# Patient Record
Sex: Male | Born: 1975 | Race: White | Hispanic: No | Marital: Married | Smoking: Never smoker
Health system: Southern US, Community
[De-identification: ages and names within clinical notes are randomized; demographics above are authoritative.]

## PROBLEM LIST (undated history)

## (undated) DIAGNOSIS — M1990 Unspecified osteoarthritis, unspecified site: Secondary | ICD-10-CM

## (undated) DIAGNOSIS — L409 Psoriasis, unspecified: Secondary | ICD-10-CM

## (undated) HISTORY — PX: SKIN BIOPSY: SHX1

## (undated) HISTORY — DX: Unspecified osteoarthritis, unspecified site: M19.90

## (undated) HISTORY — PX: CYSTECTOMY: SUR359

## (undated) HISTORY — DX: Psoriasis, unspecified: L40.9

---

## 2016-09-14 ENCOUNTER — Encounter: Payer: Self-pay | Admitting: Family Medicine

## 2016-09-14 ENCOUNTER — Ambulatory Visit (INDEPENDENT_AMBULATORY_CARE_PROVIDER_SITE_OTHER): Payer: BLUE CROSS/BLUE SHIELD | Admitting: Family Medicine

## 2016-09-14 VITALS — BP 114/72 | HR 66 | Temp 98.2°F | Resp 18 | Ht 66.0 in | Wt 132.0 lb

## 2016-09-14 DIAGNOSIS — Z833 Family history of diabetes mellitus: Secondary | ICD-10-CM | POA: Insufficient documentation

## 2016-09-14 DIAGNOSIS — Z79899 Other long term (current) drug therapy: Secondary | ICD-10-CM

## 2016-09-14 DIAGNOSIS — Z0000 Encounter for general adult medical examination without abnormal findings: Secondary | ICD-10-CM

## 2016-09-14 DIAGNOSIS — L4050 Arthropathic psoriasis, unspecified: Secondary | ICD-10-CM | POA: Diagnosis not present

## 2016-09-14 DIAGNOSIS — Z8349 Family history of other endocrine, nutritional and metabolic diseases: Secondary | ICD-10-CM | POA: Insufficient documentation

## 2016-09-14 DIAGNOSIS — L409 Psoriasis, unspecified: Secondary | ICD-10-CM | POA: Diagnosis not present

## 2016-09-14 DIAGNOSIS — M722 Plantar fascial fibromatosis: Secondary | ICD-10-CM | POA: Diagnosis not present

## 2016-09-14 DIAGNOSIS — Z5181 Encounter for therapeutic drug level monitoring: Secondary | ICD-10-CM | POA: Insufficient documentation

## 2016-09-14 NOTE — Progress Notes (Signed)
Chief Complaint  Patient presents with  . Annual Exam    Subjective:  Rick Santiago is a 41 y.o. male here for a health maintenance visit.  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 He was under the care of dermatology for Psoriasis He reports that he was initially diagnosed and started on mtx by Rheumatology He has relocated here from the [place] and needs to establish with Rheumatology He has no side effects from MTX and takes folic acid His last labs were wnl He reports that he is complaint and takes 12.71m once a week He reports that he takes five 2.596mtablets of mtx weekly  Psoriasis He reports that he gets mole checks at Dermatology and had a recent biopsy of a mole that was removed that was concerning for cancer He also gets treated for Psoriasis by Dermatology and needs a referral  Family history of diabetes and hemochromatosis He reports that his mother has hemochromatosis and he has a gene for it but reports that at this point he just gets iron checked with his labs His mother has a history of diabetes that is well controlled.   Patient Active Problem List   Diagnosis Date Noted  . Psoriatic arthritis (HCTalmo03/01/2017  . FHx: hemochromatosis 09/14/2016  . Encounter for methotrexate monitoring 09/14/2016  . Family history of diabetes mellitus in mother 09/14/2016    Past Medical History:  Diagnosis Date  . Arthritis     History reviewed. No pertinent surgical history.   No outpatient prescriptions prior to visit.   No facility-administered medications prior to visit.     No Known Allergies   Family History  Problem Relation Age of Onset  . Diabetes Mother   . Heart disease Father      Health Habits: Dental Exam: up to date Eye Exam: up to date Diet: balanced  Social History   Social History  . Marital status: Married    Spouse name: N/A  . Number of children: N/A  . Years of education: N/A    Occupational History  . Not on file.   Social History Main Topics  . Smoking status: Never Smoker  . Smokeless tobacco: Never Used  . Alcohol use No  . Drug use: No  . Sexual activity: Not on file   Other Topics Concern  . Not on file   Social History Narrative  . No narrative on file   History  Alcohol Use No   History  Smoking Status  . Never Smoker  Smokeless Tobacco  . Never Used   History  Drug Use No    Health Maintenance: See under health Maintenance activity for review of completion dates as well. Immunization History  Administered Date(s) Administered  . Influenza-Unspecified 04/10/2016      Depression Screen-PHQ2/9 Depression screen PHQ 2/9 09/14/2016  Decreased Interest 0  Down, Depressed, Hopeless 0  PHQ - 2 Score 0      Depression Severity and Treatment Recommendations:  0-4= None  5-9= Mild / Treatment: Support, educate to call if worse; return in one month  10-14= Moderate / Treatment: Support, watchful waiting; Antidepressant or Psycotherapy  15-19= Moderately severe / Treatment: Antidepressant OR Psychotherapy  >= 20 = Major depression, severe / Antidepressant AND Psychotherapy    Review of Systems   Review of Systems  Constitutional: Negative for chills, fever and weight loss.  HENT: Positive for ear pain. Negative for congestion, ear discharge, hearing loss, nosebleeds,  sinus pain and tinnitus.   Eyes: Negative for blurred vision, double vision and photophobia.  Respiratory: Negative for cough, hemoptysis, sputum production, shortness of breath and wheezing.   Cardiovascular: Negative for chest pain, palpitations, orthopnea and claudication.  Gastrointestinal: Negative for abdominal pain, constipation, diarrhea, nausea and vomiting.  Genitourinary: Negative for dysuria, frequency, hematuria and urgency.  Musculoskeletal: Positive for joint pain. Negative for back pain, falls, myalgias and neck pain.  Skin: Negative for itching  and rash.  Neurological: Negative for dizziness, tingling, tremors and headaches.  Psychiatric/Behavioral: Negative for depression. The patient is not nervous/anxious and does not have insomnia.     See HPI for ROS as well.    Objective:   Vitals:   09/14/16 1037  BP: 114/72  Pulse: 66  Resp: 18  Temp: 98.2 F (36.8 C)  TempSrc: Oral  SpO2: 99%  Weight: 132 lb (59.9 kg)  Height: 5' 6"  (1.676 m)    Body mass index is 21.31 kg/m.  Physical Exam  Constitutional: He is oriented to person, place, and time. He appears well-developed and well-nourished.  HENT:  Head: Normocephalic and atraumatic.  Right Ear: External ear normal.  Left Ear: External ear normal.  Nose: Nose normal.  Mouth/Throat: Oropharynx is clear and moist.  Eyes: Conjunctivae and EOM are normal. Right eye exhibits no discharge. Left eye exhibits no discharge. No scleral icterus.  Fundoscopic exam:      The right eye shows no AV nicking, no exudate, no hemorrhage and no papilledema.       The left eye shows no AV nicking, no exudate, no hemorrhage and no papilledema.  Neck: Normal range of motion. Neck supple. No thyromegaly present.  Cardiovascular: Normal rate, regular rhythm, normal heart sounds and intact distal pulses.   No murmur heard. Pulmonary/Chest: Effort normal and breath sounds normal. No respiratory distress. He has no wheezes. He has no rales. He exhibits no tenderness.  Abdominal: Soft. Bowel sounds are normal. He exhibits no distension and no mass. There is no tenderness. There is no rebound.  Musculoskeletal: Normal range of motion. He exhibits no edema, tenderness or deformity.  Neurological: He is alert and oriented to person, place, and time. He has normal reflexes. He displays normal reflexes. No cranial nerve deficit.  Skin: Skin is warm.  [place] pink plaques on shin and behind ears  Psychiatric: He has a normal mood and affect. His behavior is normal. Judgment and thought content  normal.       Assessment/Plan:   Patient was seen for a health maintenance exam.  Counseled the patient on health maintenance issues. Reviewed her health mainteance schedule and ordered appropriate tests (see orders.) Counseled on regular exercise and weight management. Recommend regular eye exams and dental cleaning.   The following issues were addressed today for health maintenance:   Jamine was seen today for annual exam.  Diagnoses and all orders for this visit:  Health maintenance examination- Age appropriate screenings discussed -     Comprehensive metabolic panel -     Lipid panel  Psoriatic arthritis ([place])- continue MTX and folate Referral placed for Rheumatology -     Ambulatory referral to Rheumatology  FHx: hemochromatosis -     Ambulatory referral to Rheumatology -     CBC with Differential/Platelet -     Iron  Encounter for methotrexate monitoring- pt low risk for TB exposure thus will not get xray Will check labs to monitor for effects of MTX Advised routine eye exams Pt  up to date for eye exams -     Ambulatory referral to Rheumatology -     Comprehensive metabolic panel -     CBC with Differential/Platelet -     Iron  Psoriasis- continue MTX, follow up with Dermatology -     Ambulatory referral to Dermatology  Plantar fasciitis of right foot- stable currently, will refer to Podiatry for continuity of care -     Ambulatory referral to Podiatry  Family history of diabetes mellitus in mother- will screen today with FASTING glucose on CMP    No Follow-up on file.    Body mass index is 21.31 kg/m.:  Discussed the patient's BMI with patient. The BMI body mass index is 21.31 kg/m.     No future appointments.  Patient Instructions       IF you received an x-ray today, you will receive an invoice from [facility]. Please contact [facility] at [phone] with questions or concerns regarding your invoice.   IF you received  labwork today, you will receive an invoice from [place]. Please contact [facility] at [phone] with questions or concerns regarding your invoice.   Our billing staff will not be able to assist you with questions regarding bills from these companies.  You will be contacted with the lab results as soon as they are available. The fastest way to get your results is to activate your My Chart account. Instructions are located on the last page of this paperwork. If you have not heard from [place] regarding the results in 2 weeks, please contact this office.     Health Maintenance, Male A healthy lifestyle and preventive care is important for your health and wellness. Ask your health care provider about what schedule of regular examinations is right for you. What should I know about weight and diet?  Eat a Healthy Diet  Eat plenty of vegetables, fruits, whole grains, low-fat dairy products, and lean protein.  Do not eat a lot of foods high in solid fats, added sugars, or salt. Maintain a Healthy Weight  Regular exercise can help you achieve or maintain a healthy weight. You should:  Do at least 150 minutes of exercise each week. The exercise should increase your heart rate and make you sweat (moderate-intensity exercise).  Do strength-training exercises at least twice a week. Watch Your Levels of Cholesterol and Blood Lipids  Have your blood tested for lipids and cholesterol every 5 years starting at 43 years of age. If you are at high risk for heart disease, you should start having your blood tested when you are 43 years old. You may need to have your cholesterol levels checked more often if:  Your lipid or cholesterol levels are high.  You are older than 43 years of age.  You are at high risk for heart disease. What should I know about cancer screening? Many types of cancers can be detected early and may often be prevented. Lung Cancer  You should be screened every year for lung cancer  if:  You are a current smoker who has smoked for at least 30 years.  You are a former smoker who has quit within the past 15 years.  Talk to your health care provider about your screening options, when you should start screening, and how often you should be screened. Colorectal Cancer  Routine colorectal cancer screening usually begins at 43 years of age and should be repeated every 5-10 years until you are 43 years old. You may need  to be screened more often if early forms of precancerous polyps or small growths are found. Your health care provider may recommend screening at an earlier age if you have risk factors for colon cancer.  Your health care provider may recommend using home test kits to check for hidden blood in the stool.  A small camera at the end of a tube can be used to examine your colon (sigmoidoscopy or colonoscopy). This checks for the earliest forms of colorectal cancer. Prostate and Testicular Cancer  Depending on your age and overall health, your health care provider may do certain tests to screen for prostate and testicular cancer.  Talk to your health care provider about any symptoms or concerns you have about testicular or prostate cancer. Skin Cancer  Check your skin from head to toe regularly.  Tell your health care provider about any new moles or changes in moles, especially if:  There is a change in a mole's size, shape, or color.  You have a mole that is larger than a pencil eraser.  Always use sunscreen. Apply sunscreen liberally and repeat throughout the day.  Protect yourself by wearing long sleeves, pants, a wide-brimmed hat, and sunglasses when outside. What should I know about heart disease, diabetes, and high blood pressure?  If you are 41-40 years of age, have your blood pressure checked every 3-5 years. If you are 60 years of age or older, have your blood pressure checked every year. You should have your blood pressure measured twice-once when  you are at a hospital or clinic, and once when you are not at a hospital or clinic. Record the average of the two measurements. To check your blood pressure when you are not at a hospital or clinic, you can use:  An automated blood pressure machine at a pharmacy.  A home blood pressure monitor.  Talk to your health care provider about your target blood pressure.  If you are between 82-32 years old, ask your health care provider if you should take aspirin to prevent heart disease.  Have regular diabetes screenings by checking your fasting blood sugar level.  If you are at a normal weight and have a low risk for diabetes, have this test once every three years after the age of 24.  If you are overweight and have a high risk for diabetes, consider being tested at a younger age or more often.  A one-time screening for abdominal aortic aneurysm (AAA) by ultrasound is recommended for men aged 40-75 years who are current or former smokers. What should I know about preventing infection? Hepatitis B  If you have a higher risk for hepatitis B, you should be screened for this virus. Talk with your health care provider to find out if you are at risk for hepatitis B infection. Hepatitis C  Blood testing is recommended for:  Everyone born from [birth year] through 1967.  Anyone with known risk factors for hepatitis C. Sexually Transmitted Diseases (STDs)  You should be screened each year for STDs including gonorrhea and chlamydia if:  You are sexually active and are younger than 43 years of age.  You are older than 43 years of age and your health care provider tells you that you are at risk for this type of infection.  Your sexual activity has changed since you were last screened and you are at an increased risk for chlamydia or gonorrhea. Ask your health care provider if you are at risk.  Talk with your health care  provider about whether you are at high risk of being infected with HIV. Your health care  provider may recommend a prescription medicine to help prevent HIV infection. What else can I do?  Schedule regular health, dental, and eye exams.  Stay current with your vaccines (immunizations).  Do not use any tobacco products, such as cigarettes, chewing tobacco, and e-cigarettes. If you need help quitting, ask your health care provider.  Limit alcohol intake to no more than 2 drinks per day. One drink equals 12 ounces of beer, 5 ounces of wine, or 1 ounces of hard liquor.  Do not use street drugs.  Do not share needles.  Ask your health care provider for help if you need support or information about quitting drugs.  Tell your health care provider if you often feel depressed.  Tell your health care provider if you have ever been abused or do not feel safe at home. This information is not intended to replace advice given to you by your health care provider. Make sure you discuss any questions you have with your health care provider. Document Released: 12/24/2007 Document Revised: 02/24/2016 Document Reviewed: 03/31/2015 Elsevier Interactive Patient Education  2017 [employer].

## 2016-09-14 NOTE — Patient Instructions (Addendum)
IF you received an x-ray today, you will receive an invoice from [facility]. Please contact [facility] at [phone] with questions or concerns regarding your invoice.   IF you received labwork today, you will receive an invoice from [place]. Please contact [facility] at [phone] with questions or concerns regarding your invoice.   Our billing staff will not be able to assist you with questions regarding bills from these companies.  You will be contacted with the lab results as soon as they are available. The fastest way to get your results is to activate your My Chart account. Instructions are located on the last page of this paperwork. If you have not heard from [place] regarding the results in 2 weeks, please contact this office.     Health Maintenance, Male A healthy lifestyle and preventive care is important for your health and wellness. Ask your health care provider about what schedule of regular examinations is right for you. What should I know about weight and diet?  Eat a Healthy Diet  Eat plenty of vegetables, fruits, whole grains, low-fat dairy products, and lean protein.  Do not eat a lot of foods high in solid fats, added sugars, or salt. Maintain a Healthy Weight  Regular exercise can help you achieve or maintain a healthy weight. You should:  Do at least 150 minutes of exercise each week. The exercise should increase your heart rate and make you sweat (moderate-intensity exercise).  Do strength-training exercises at least twice a week. Watch Your Levels of Cholesterol and Blood Lipids  Have your blood tested for lipids and cholesterol every 5 years starting at 43 years of age. If you are at high risk for heart disease, you should start having your blood tested when you are 43 years old. You may need to have your cholesterol levels checked more often if:  Your lipid or cholesterol levels are high.  You are older than 43 years of age.  You are  at high risk for heart disease. What should I know about cancer screening? Many types of cancers can be detected early and may often be prevented. Lung Cancer  You should be screened every year for lung cancer if:  You are a current smoker who has smoked for at least 30 years.  You are a former smoker who has quit within the past 15 years.  Talk to your health care provider about your screening options, when you should start screening, and how often you should be screened. Colorectal Cancer  Routine colorectal cancer screening usually begins at 43 years of age and should be repeated every 5-10 years until you are 43 years old. You may need to be screened more often if early forms of precancerous polyps or small growths are found. Your health care provider may recommend screening at an earlier age if you have risk factors for colon cancer.  Your health care provider may recommend using home test kits to check for hidden blood in the stool.  A small camera at the end of a tube can be used to examine your colon (sigmoidoscopy or colonoscopy). This checks for the earliest forms of colorectal cancer. Prostate and Testicular Cancer  Depending on your age and overall health, your health care provider may do certain tests to screen for prostate and testicular cancer.  Talk to your health care provider about any symptoms or concerns you have about testicular or prostate cancer. Skin Cancer  Check your skin from head to toe regularly.  Tell your health care provider about any new moles or changes in moles, especially if:  There is a change in a mole's size, shape, or color.  You have a mole that is larger than a pencil eraser.  Always use sunscreen. Apply sunscreen liberally and repeat throughout the day.  Protect yourself by wearing long sleeves, pants, a wide-brimmed hat, and sunglasses when outside. What should I know about heart disease, diabetes, and high blood pressure?  If you are  60-64 years of age, have your blood pressure checked every 3-5 years. If you are 56 years of age or older, have your blood pressure checked every year. You should have your blood pressure measured twice-once when you are at a hospital or clinic, and once when you are not at a hospital or clinic. Record the average of the two measurements. To check your blood pressure when you are not at a hospital or clinic, you can use:  An automated blood pressure machine at a pharmacy.  A home blood pressure monitor.  Talk to your health care provider about your target blood pressure.  If you are between 66-106 years old, ask your health care provider if you should take aspirin to prevent heart disease.  Have regular diabetes screenings by checking your fasting blood sugar level.  If you are at a normal weight and have a low risk for diabetes, have this test once every three years after the age of 22.  If you are overweight and have a high risk for diabetes, consider being tested at a younger age or more often.  A one-time screening for abdominal aortic aneurysm (AAA) by ultrasound is recommended for men aged 100-75 years who are current or former smokers. What should I know about preventing infection? Hepatitis B  If you have a higher risk for hepatitis B, you should be screened for this virus. Talk with your health care provider to find out if you are at risk for hepatitis B infection. Hepatitis C  Blood testing is recommended for:  Everyone born from [birth year] through 1967.  Anyone with known risk factors for hepatitis C. Sexually Transmitted Diseases (STDs)  You should be screened each year for STDs including gonorrhea and chlamydia if:  You are sexually active and are younger than 43 years of age.  You are older than 43 years of age and your health care provider tells you that you are at risk for this type of infection.  Your sexual activity has changed since you were last screened and you are at  an increased risk for chlamydia or gonorrhea. Ask your health care provider if you are at risk.  Talk with your health care provider about whether you are at high risk of being infected with HIV. Your health care provider may recommend a prescription medicine to help prevent HIV infection. What else can I do?  Schedule regular health, dental, and eye exams.  Stay current with your vaccines (immunizations).  Do not use any tobacco products, such as cigarettes, chewing tobacco, and e-cigarettes. If you need help quitting, ask your health care provider.  Limit alcohol intake to no more than 2 drinks per day. One drink equals 12 ounces of beer, 5 ounces of wine, or 1 ounces of hard liquor.  Do not use street drugs.  Do not share needles.  Ask your health care provider for help if you need support or information about quitting drugs.  Tell your health care provider if you often feel depressed.  Tell your health care provider if you have ever been abused or do not feel safe at home. This information is not intended to replace advice given to you by your health care provider. Make sure you discuss any questions you have with your health care provider. Document Released: 12/24/2007 Document Revised: 02/24/2016 Document Reviewed: 03/31/2015 Elsevier Interactive Patient Education  2017 [employer].

## 2016-09-15 ENCOUNTER — Encounter: Payer: Self-pay | Admitting: Family Medicine

## 2016-09-15 LAB — CBC WITH DIFFERENTIAL/PLATELET
BASOS: 1 %
Basophils Absolute: 0 10*3/uL (ref 0.0–0.2)
EOS (ABSOLUTE): 0.1 10*3/uL (ref 0.0–0.4)
Eos: 1 %
HEMOGLOBIN: 15.9 g/dL (ref 13.0–17.7)
Hematocrit: 44.7 % (ref 37.5–51.0)
IMMATURE GRANULOCYTES: 0 %
Immature Grans (Abs): 0 10*3/uL (ref 0.0–0.1)
LYMPHS: 34 %
Lymphocytes Absolute: 1.7 10*3/uL (ref 0.7–3.1)
MCH: 31.6 pg (ref 26.6–33.0)
MCHC: 35.6 g/dL (ref 31.5–35.7)
MCV: 89 fL (ref 79–97)
MONOCYTES: 6 %
Monocytes Absolute: 0.3 10*3/uL (ref 0.1–0.9)
NEUTROS PCT: 58 %
Neutrophils Absolute: 3 10*3/uL (ref 1.4–7.0)
PLATELETS: 265 10*3/uL (ref 150–379)
RBC: 5.03 x10E6/uL (ref 4.14–5.80)
RDW: 14.5 % (ref 12.3–15.4)
WBC: 5.1 10*3/uL (ref 3.4–10.8)

## 2016-09-15 LAB — COMPREHENSIVE METABOLIC PANEL
ALT: 24 IU/L (ref 0–44)
AST: 22 IU/L (ref 0–40)
Albumin/Globulin Ratio: 2 (ref 1.2–2.2)
Albumin: 4.8 g/dL (ref 3.5–5.5)
Alkaline Phosphatase: 83 IU/L (ref 39–117)
BUN/Creatinine Ratio: 8 — ABNORMAL LOW (ref 9–20)
BUN: 8 mg/dL (ref 6–24)
Bilirubin Total: 0.7 mg/dL (ref 0.0–1.2)
CALCIUM: 9.4 mg/dL (ref 8.7–10.2)
CO2: 22 mmol/L (ref 18–29)
Chloride: 102 mmol/L (ref 96–106)
Creatinine, Ser: 0.97 mg/dL (ref 0.76–1.27)
GFR, EST AFRICAN AMERICAN: 111 mL/min/{1.73_m2} (ref >59)
GFR, EST NON AFRICAN AMERICAN: 96 mL/min/{1.73_m2} (ref >59)
GLUCOSE: 92 mg/dL (ref 65–99)
Globulin, Total: 2.4 g/dL (ref 1.5–4.5)
POTASSIUM: 4.4 mmol/L (ref 3.5–5.2)
Sodium: 142 mmol/L (ref 134–144)
TOTAL PROTEIN: 7.2 g/dL (ref 6.0–8.5)

## 2016-09-15 LAB — LIPID PANEL
CHOLESTEROL TOTAL: 186 mg/dL (ref 100–199)
Chol/HDL Ratio: 3.3 ratio units (ref 0.0–5.0)
HDL: 57 mg/dL (ref >39)
LDL CALC: 117 mg/dL — AB (ref 0–99)
Triglycerides: 58 mg/dL (ref 0–149)
VLDL CHOLESTEROL CAL: 12 mg/dL (ref 5–40)

## 2016-09-15 LAB — IRON: IRON: 113 ug/dL (ref 38–169)

## 2016-09-29 DIAGNOSIS — M722 Plantar fascial fibromatosis: Secondary | ICD-10-CM | POA: Insufficient documentation

## 2016-09-29 DIAGNOSIS — L409 Psoriasis, unspecified: Secondary | ICD-10-CM | POA: Insufficient documentation

## 2016-09-29 DIAGNOSIS — Z79899 Other long term (current) drug therapy: Secondary | ICD-10-CM | POA: Insufficient documentation

## 2016-09-29 NOTE — Progress Notes (Signed)
Office Visit Note  Patient: Rick Santiago             Date of Birth: 09/04/1975           MRN: [id number]             PCP: [name], MD Referring: [name], MD Visit Date: 10/03/2016 Occupation: @GUAROCC @    Subjective:  Pain in hands  History of Present Illness: Rick Santiago is a 41 y.o. male seen in consultation per request of his PCP area and according to patient he developed psoriasis when he was 41 years old. The psoriasis fluctuated and he never required any treatment for that. About 2-1/2 years ago he started having increased pain and swelling in his hands which she describes over the MCPs and PIPs. His mother has history of hemachromatosis. For that reason he was seen by her rheumatologist in [place] who did workup and diagnosed him with psoriatic arthritis. He was started on methotrexate 2.5 mg 5 tablets by mouth every week along with folic acid. His labs were monitored which were normal. He states he did fairly well on that dose he had occasional flares about every its months which lasted for about 2-3 weeks. But he has not had any flare in the last 1 year. He believes his arthritis is quite well-controlled on current dose of methotrexate. He had few episodes of plantar fasciitis before starting methotrexate. He denies any pain and swelling in any other joints. There is no history of eye involvement.   Activities of Daily Living:  Patient reports morning stiffness for 0 minute.   Patient Denies nocturnal pain.  Difficulty dressing/grooming: Denies Difficulty climbing stairs: Denies Difficulty getting out of chair: Denies Difficulty using hands for taps, buttons, cutlery, and/or writing: Denies   Review of Systems  Constitutional: Negative for fatigue, night sweats and weakness ( ).  HENT: Negative for mouth sores, mouth dryness and nose dryness.   Eyes: Negative for redness and dryness.  Respiratory: Negative for shortness of breath and difficulty  breathing.   Cardiovascular: Negative for chest pain, palpitations, hypertension, irregular heartbeat and swelling in legs/feet.  Gastrointestinal: Negative for constipation and diarrhea.  Endocrine: Negative for increased urination.  Musculoskeletal: Negative for arthralgias, joint pain, joint swelling, myalgias, muscle weakness, morning stiffness, muscle tenderness and myalgias.  Skin: Positive for rash. Negative for color change, hair loss, nodules/bumps, skin tightness, ulcers and sensitivity to sunlight.       Lesions of psoriasis on extremities and the umbilical area  Allergic/Immunologic: Negative for susceptible to infections.  Neurological: Negative for dizziness, fainting, memory loss and night sweats.  Hematological: Negative for swollen glands.  Psychiatric/Behavioral: Negative for depressed mood and sleep disturbance. The patient is not nervous/anxious.     PMFS History:  Patient Active Problem List   Diagnosis Date Noted  . Plantar fasciitis 09/29/2016  . Psoriasis 09/29/2016  . High risk medication use 09/29/2016  . Psoriatic arthritis ([place]) 09/14/2016  . FHx: hemochromatosis 09/14/2016  . Encounter for methotrexate monitoring 09/14/2016  . Family history of diabetes mellitus in mother 09/14/2016    Past Medical History:  Diagnosis Date  . Arthritis     Family History  Problem Relation Age of Onset  . Diabetes Mother   . Heart disease Father    History reviewed. No pertinent surgical history. Social History   Social History Narrative  . No narrative on file     Objective: Vital Signs: BP 107/73 (BP Location: Right Arm, Patient  Position: Sitting, Cuff Size: Normal)   Pulse (!) 54   Resp 12   Ht 5' 7"  (1.702 m)   Wt 137 lb (62.1 kg)   BMI 21.46 kg/m    Physical Exam  Constitutional: He is oriented to person, place, and time. He appears well-developed and well-nourished.  HENT:  Head: Normocephalic and atraumatic.  Eyes: Conjunctivae and EOM are  normal. Pupils are equal, round, and reactive to light.  Neck: Normal range of motion. Neck supple.  Cardiovascular: Normal rate, regular rhythm and normal heart sounds.   Pulmonary/Chest: Effort normal and breath sounds normal.  Abdominal: Soft. Bowel sounds are normal.  Neurological: He is alert and oriented to person, place, and time.  Skin: Skin is warm and dry. Capillary refill takes less than 2 seconds. Rash noted.  Psoriasis plaques on bilateral elbows bilateral shin area,scalp and umbilical area  Psychiatric: He has a normal mood and affect. His behavior is normal.  Nursing note and vitals reviewed.    Musculoskeletal Exam: C-spine and thoracic lumbar spine good range of motion no SI joint tenderness. Shoulder joints, elbow joints, wrist joints, MCPs, PIPs DIPs with good range of motion with no synovitis. Hip joints knee joints ankles MTPs PIPs DIPs with good range of motion. He had no plantar fasciitis Achillis tendinitis on examination.  CDAI Exam: No CDAI exam completed.    Investigation: Findings:  09/14/2016 CBC normal, CMP normal, lipid panel LDL 117    Imaging: Xr Foot 2 Views Left  Result Date: 10/03/2016 No MTP PIP/DIP narrowing was noted. A small calcaneal spur was noted. No erosive changes were noted. Impression: These findings are consistent with osteoarthritis of the foot  Xr Foot 2 Views Right  Result Date: 10/03/2016 No MTP PIP/DIP narrowing was noted. A small calcaneal spur was noted. No erosive changes were noted. Impression: These findings are consistent with osteoarthritis of the foot  Xr Hand 2 View Left  Result Date: 10/03/2016 Mild PIP/DIP narrowing was noted. No MCP joint or intercarpal joint space narrowing was noted. No erosive changes were noted. Impression: These findings could be consistent with mild osteoarthritis or psoriatic arthritis.  Xr Hand 2 View Right  Result Date: 10/03/2016 Mild PIP/DIP narrowing was noted. No MCP joint or  intercarpal joint space narrowing was noted. No erosive changes were noted. Impression: These findings could be consistent with mild osteoarthritis or psoriatic arthritis.  Xr Pelvis 1-2 Views  Result Date: 10/03/2016 Mild SI joint sclerosis noted in the left SI joint. No SI joint narrowing was noted. These findings could be consistent with psoriatic arthritis.   Speciality Comments: No specialty comments available.    Procedures:  No procedures performed Allergies: Patient has no known allergies.   Assessment / Plan:     Visit Diagnoses: Psoriatic arthritis ([place]): He had no synovitis on examination today he states he has not had a flare in one year. He is on low-dose methotrexate. He does not want to increase the dose of methotrexate. He states she's been concerned about the side effects of methotrexate although he's never experienced any side effects from methotrexate. Today detailed counseling was provided handout was given consent was taken. We also discussed different treatment options. He does not like any of the biologic agents. I discussed the option of trying Rutherford Nail if it's approved and a handout was given.  Psoriasis: He has several psoriasis patches on examination today and is a scalp behind its years, on his trunk, and umbilical area, over elbows, bilateral shins.  High risk medication use - Methotrexate 5 tablets by mouth every week, folic acid 1 mg by mouth daily  Plantar fasciitis: He had no recent episodes  FHx: hemochromatosis - in mother  Family history of psoriasis - In paternal grandfather    Orders: Orders Placed This Encounter  Procedures  . XR Hand 2 View Right  . XR Hand 2 View Left  . XR Foot 2 Views Right  . XR Foot 2 Views Left  . XR Pelvis 1-2 Views  . DG Chest 2 View  . Quantiferon tb gold assay (blood)  . Serum protein electrophoresis with reflex  . Hepatitis B core antibody, IgM  . Hepatitis B surface antigen  . Hepatitis C antibody  . HIV  antibody  . IgG, IgA, IgM  . CBC with Differential/Platelet  . COMPLETE METABOLIC PANEL WITH GFR   No orders of the defined types were placed in this encounter.   Face-to-face time spent with patient was 45 minutes. 50% of time was spent in counseling and coordination of care.  Follow-Up Instructions: Return in about 5 months (around 03/05/2017) for Psoriatic arthritis, psoriasis.   Bo Merino, MD  Note - This record has been created using Editor, commissioning.  Chart creation errors have been sought, but may not always  have been located. Such creation errors do not reflect on  the standard of medical care.

## 2016-10-03 ENCOUNTER — Ambulatory Visit (HOSPITAL_COMMUNITY)
Admission: RE | Admit: 2016-10-03 | Discharge: 2016-10-03 | Disposition: A | Discharge status: Home / Self Care | Payer: BLUE CROSS/BLUE SHIELD | Source: Ambulatory Visit | Attending: Rheumatology | Admitting: Rheumatology

## 2016-10-03 ENCOUNTER — Ambulatory Visit (INDEPENDENT_AMBULATORY_CARE_PROVIDER_SITE_OTHER): Payer: Self-pay

## 2016-10-03 ENCOUNTER — Ambulatory Visit (INDEPENDENT_AMBULATORY_CARE_PROVIDER_SITE_OTHER): Payer: BLUE CROSS/BLUE SHIELD | Admitting: Rheumatology

## 2016-10-03 ENCOUNTER — Encounter: Payer: Self-pay | Admitting: Rheumatology

## 2016-10-03 VITALS — BP 107/73 | HR 54 | Resp 12 | Ht 67.0 in | Wt 137.0 lb

## 2016-10-03 DIAGNOSIS — M722 Plantar fascial fibromatosis: Secondary | ICD-10-CM

## 2016-10-03 DIAGNOSIS — Z840 Family history of diseases of the skin and subcutaneous tissue: Secondary | ICD-10-CM

## 2016-10-03 DIAGNOSIS — Z8349 Family history of other endocrine, nutritional and metabolic diseases: Secondary | ICD-10-CM | POA: Diagnosis not present

## 2016-10-03 DIAGNOSIS — L4050 Arthropathic psoriasis, unspecified: Secondary | ICD-10-CM

## 2016-10-03 DIAGNOSIS — L409 Psoriasis, unspecified: Secondary | ICD-10-CM

## 2016-10-03 DIAGNOSIS — Z79899 Other long term (current) drug therapy: Secondary | ICD-10-CM

## 2016-10-03 NOTE — Progress Notes (Signed)
Pharmacy Note  Subjective: Patient presents today to the [facility] to see Dr. Estanislado Pandy.  Patient is currently taking methotrexate 5 tablets weekly and folic acid 1 mg daily.  Patient seen by the pharmacist for counseling on methotrexate.    Objective: CBC    Component Value Date/Time   WBC 5.1 09/14/2016 1206   RBC 5.03 09/14/2016 1206   HCT 44.7 09/14/2016 1206   PLT 265 09/14/2016 1206   MCV 89 09/14/2016 1206   MCH 31.6 09/14/2016 1206   MCHC 35.6 09/14/2016 1206   RDW 14.5 09/14/2016 1206   LYMPHSABS 1.7 09/14/2016 1206   EOSABS 0.1 09/14/2016 1206   BASOSABS 0.0 09/14/2016 1206   CMP     Component Value Date/Time   NA 142 09/14/2016 1206   K 4.4 09/14/2016 1206   CL 102 09/14/2016 1206   CO2 22 09/14/2016 1206   GLUCOSE 92 09/14/2016 1206   BUN 8 09/14/2016 1206   CREATININE 0.97 09/14/2016 1206   CALCIUM 9.4 09/14/2016 1206   PROT 7.2 09/14/2016 1206   ALBUMIN 4.8 09/14/2016 1206   AST 22 09/14/2016 1206   ALT 24 09/14/2016 1206   ALKPHOS 83 09/14/2016 1206   BILITOT 0.7 09/14/2016 1206   GFRNONAA 96 09/14/2016 1206   GFRAA 111 09/14/2016 1206   TB Gold: ordered today Hepatitis panel: ordered today HIV: ordered today  Chest-xray:  Ordered today  Contraception: Strict contraception advised  Assessment/Plan:  Patient was counseled on the purpose, proper use, and adverse effects of methotrexate including nausea, infection, and signs and symptoms of pneumonitis.  Reviewed instructions with patient to take methotrexate weekly along with folic acid daily.  Discussed the importance of frequent monitoring of kidney and liver function and blood counts, and provided patient with standing lab instructions.  Patient will be due for standing labs again in June 2018 and every 3 months.  Provided patient with standing lab instructions and standing lab orders were placed.  Counseled patient to avoid NSAIDs and alcohol while on methotrexate.  Provided patient  with educational materials on methotrexate and answered all questions.  Patient consented to methotrexate use.  Will upload into chart.    Elisabeth Most, Pharm.D., BCPS Clinical Pharmacist Pager: [phone] Phone: [phone] 10/03/2016 9:07 AM

## 2016-10-03 NOTE — Progress Notes (Signed)
Notify pt

## 2016-10-03 NOTE — Patient Instructions (Addendum)
Rick Santiago We placed an order today for your Rick lab work.    Please come back and get your Rick Santiago in June 2018 and every 3 months  We have open lab Monday through Friday from 8:30-11:30 AM and 1:30-4 PM at the office of Dr. Tresa Moore, PA.   The office is located at [address] [place], [place], [place] [address] No appointment is necessary.   Santiago are drawn by [facility].  You may receive a bill from [facility] for your lab work.     Methotrexate tablets What is this medicine? METHOTREXATE (METH oh TREX ate) is a chemotherapy drug used to treat cancer including breast cancer, leukemia, and lymphoma. This medicine can also be used to treat psoriasis and certain kinds of arthritis. This medicine may be used for other purposes; ask your health care provider or pharmacist if you have questions. COMMON BRAND NAME(S): Rheumatrex, Trexall What should I tell my health care provider before I take this medicine? They need to know if you have any of these conditions: -fluid in the stomach area or lungs -if you often drink alcohol -infection or immune system problems -kidney disease or on hemodialysis -liver disease -low blood counts, like low white cell, platelet, or red cell counts -lung disease -radiation therapy -stomach ulcers -ulcerative colitis -an unusual or allergic reaction to methotrexate, other medicines, foods, dyes, or preservatives -pregnant or trying to get pregnant -breast-feeding How should I use this medicine? Take this medicine by mouth with a glass of water. Follow the directions on the prescription label. Take your medicine at regular intervals. Do not take it more often than directed. Do not stop taking except on your doctor's advice. Make sure you know why you are taking this medicine and how often you should take it. If this medicine is used for a condition that is not cancer, like arthritis or psoriasis, it should be taken weekly,  NOT daily. Taking this medicine more often than directed can cause serious side effects, even death. Talk to your healthcare provider about safe handling and disposal of this medicine. You may need to take special precautions. Talk to your pediatrician regarding the use of this medicine in children. While this drug may be prescribed for selected conditions, precautions do apply. Overdosage: If you think you have taken too much of this medicine contact a poison control center or emergency room at once. NOTE: This medicine is only for you. Do not share this medicine with others. What if I miss a dose? If you miss a dose, talk with your doctor or health care professional. Do not take double or extra doses. What may interact with this medicine? This medicine may interact with the following medication: -acitretin -aspirin and aspirin-like medicines including salicylates -azathioprine -certain antibiotics like penicillins, tetracycline, and chloramphenicol -cyclosporine -gold -hydroxychloroquine -live virus vaccines -NSAIDs, medicines for pain and inflammation, like ibuprofen or naproxen -other cytotoxic agents -penicillamine -phenylbutazone -phenytoin -probenecid -retinoids such as isotretinoin and tretinoin -steroid medicines like prednisone or cortisone -sulfonamides like sulfasalazine and trimethoprim/sulfamethoxazole -theophylline This list may not describe all possible interactions. Give your health care provider a list of all the medicines, herbs, non-prescription drugs, or dietary supplements you use. Also tell them if you smoke, drink alcohol, or use illegal drugs. Some items may interact with your medicine. What should I watch for while using this medicine? Avoid alcoholic drinks. This medicine can make you more sensitive to the sun. Keep out of the sun. If you cannot avoid  being in the sun, wear protective clothing and use sunscreen. Do not use sun lamps or tanning  beds/booths. You may need blood work done while you are taking this medicine. Call your doctor or health care professional for advice if you get a fever, chills or sore throat, or other symptoms of a cold or flu. Do not treat yourself. This drug decreases your body's ability to fight infections. Try to avoid being around people who are sick. This medicine may increase your risk to bruise or bleed. Call your doctor or health care professional if you notice any unusual bleeding. Check with your doctor or health care professional if you get an attack of severe diarrhea, nausea and vomiting, or if you sweat a lot. The loss of too much body fluid can make it dangerous for you to take this medicine. Talk to your doctor about your risk of cancer. You may be more at risk for certain types of cancers if you take this medicine. Both men and women must use effective birth control with this medicine. Do not become pregnant while taking this medicine or until at least 1 normal menstrual cycle has occurred after stopping it. Women should inform their doctor if they wish to become pregnant or think they might be pregnant. Men should not father a child while taking this medicine and for 3 months after stopping it. There is a potential for serious side effects to an unborn child. Talk to your health care professional or pharmacist for more information. Do not breast-feed an infant while taking this medicine. What side effects may I notice from receiving this medicine? Side effects that you should report to your doctor or health care professional as soon as possible: -allergic reactions like skin rash, itching or hives, swelling of the face, lips, or tongue -breathing problems or shortness of breath -diarrhea -dry, nonproductive cough -low blood counts - this medicine may decrease the number of white blood cells, red blood cells and platelets. You may be at increased risk for infections and bleeding. -mouth  sores -redness, blistering, peeling or loosening of the skin, including inside the mouth -signs of infection - fever or chills, cough, sore throat, pain or trouble passing urine -signs and symptoms of bleeding such as bloody or black, tarry stools; red or dark-brown urine; spitting up blood or brown material that looks like coffee grounds; red spots on the skin; unusual bruising or bleeding from the eye, gums, or nose -signs and symptoms of kidney injury like trouble passing urine or change in the amount of urine -signs and symptoms of liver injury like dark yellow or brown urine; general ill feeling or flu-like symptoms; light-colored stools; loss of appetite; nausea; right upper belly pain; unusually weak or tired; yellowing of the eyes or skin Side effects that usually do not require medical attention (report to your doctor or health care professional if they continue or are bothersome): -dizziness -hair loss -tiredness -upset stomach -vomiting This list may not describe all possible side effects. Call your doctor for medical advice about side effects. You may report side effects to FDA at 1-800-FDA-1088. Where should I keep my medicine? Keep out of the reach of children. Store at room temperature between 20 and 25 degrees C (68 and 77 degrees F). Protect from light. Throw away any unused medicine after the expiration date. NOTE: This sheet is a summary. It may not cover all possible information. If you have questions about this medicine, talk to your doctor, pharmacist, or health  care provider.  2018 Elsevier/Gold Standard (2015-03-02 05:39:22)  Apremilast oral tablets What is this medicine? APREMILAST (a PRE mil ast) is used to treat plaque psoriasis and psoriatic arthritis. This medicine may be used for other purposes; ask your health care provider or pharmacist if you have questions. COMMON BRAND NAME(S): Rutherford Nail What should I tell my health care provider before I take this  medicine? They need to know if you have any of these conditions: -dehydration -kidney disease -mental illness -an unusual or allergic reaction to apremilast, other medicines, foods, dyes, or preservatives -pregnant or trying to get pregnant -breast-feeding How should I use this medicine? Take this medicine by mouth with a glass of water. Follow the directions on the prescription label. Do not cut, crush or chew this medicine. You can take it with or without food. If it upsets your stomach, take it with food. Take your medicine at regular intervals. Do not take it more often than directed. Do not stop taking except on your doctor's advice. Talk to your pediatrician regarding the use of this medicine in children. Special care may be needed. Overdosage: If you think you have taken too much of this medicine contact a poison control center or emergency room at once. NOTE: This medicine is only for you. Do not share this medicine with others. What if I miss a dose? If you miss a dose, take it as soon as you can. If it is almost time for your next dose, take only that dose. Do not take double or extra doses. What may interact with this medicine? This medicine may interact with the following medications: -certain medicines for seizures like carbamazepine, phenobarbital, phenytoin -rifampin This list may not describe all possible interactions. Give your health care provider a list of all the medicines, herbs, non-prescription drugs, or dietary supplements you use. Also tell them if you smoke, drink alcohol, or use illegal drugs. Some items may interact with your medicine. What should I watch for while using this medicine? Tell your doctor or healthcare professional if your symptoms do not start to get better or if they get worse. Patients and their families should watch out for new or worsening depression or thoughts of suicide. Also watch out for sudden changes in feelings such as feeling anxious,  agitated, panicky, irritable, hostile, aggressive, impulsive, severely restless, overly excited and hyperactive, or not being able to sleep. If this happens, call your health care professional. Check with your doctor or health care professional if you get an attack of severe diarrhea, nausea and vomiting, or if you sweat a lot. The loss of too much body fluid can make it dangerous for you to take this medicine. What side effects may I notice from receiving this medicine? Side effects that you should report to your doctor or health care professional as soon as possible: -depressed mood -weight loss Side effects that usually do not require medical attention (report to your doctor or health care professional if they continue or are bothersome): -diarrhea -headache -nausea, vomiting This list may not describe all possible side effects. Call your doctor for medical advice about side effects. You may report side effects to FDA at 1-800-FDA-1088. Where should I keep my medicine? Keep out of the reach of children. Store below 30 degrees C (86 degrees F). Throw away any unused medicine after the expiration date. NOTE: This sheet is a summary. It may not cover all possible information. If you have questions about this medicine, talk to your doctor, pharmacist,  or health care provider.  2018 Elsevier/Gold Standard (2016-01-13 10:55:44)

## 2016-10-04 LAB — IGG, IGA, IGM
IGA: 243 mg/dL (ref 81–463)
IGG (IMMUNOGLOBIN G), SERUM: 997 mg/dL (ref 694–1618)
IgM, Serum: 79 mg/dL (ref 48–271)

## 2016-10-04 LAB — HEPATITIS B SURFACE ANTIGEN: HEP B S AG: NEGATIVE

## 2016-10-04 LAB — HEPATITIS C ANTIBODY: HCV Ab: NEGATIVE

## 2016-10-04 LAB — HEPATITIS B CORE ANTIBODY, IGM: HEP B C IGM: NONREACTIVE

## 2016-10-04 LAB — HIV ANTIBODY (ROUTINE TESTING W REFLEX): HIV 1&2 Ab, 4th Generation: NONREACTIVE

## 2016-10-05 LAB — PROTEIN ELECTROPHORESIS, SERUM, WITH REFLEX
ALBUMIN ELP: 4.4 g/dL (ref 3.8–4.8)
ALPHA-1-GLOBULIN: 0.3 g/dL (ref 0.2–0.3)
ALPHA-2-GLOBULIN: 0.5 g/dL (ref 0.5–0.9)
BETA 2: 0.4 g/dL (ref 0.2–0.5)
Beta Globulin: 0.4 g/dL (ref 0.4–0.6)
GAMMA GLOBULIN: 0.9 g/dL (ref 0.8–1.7)
TOTAL PROTEIN, SERUM ELECTROPHOR: 7 g/dL (ref 6.1–8.1)

## 2016-10-06 LAB — QUANTIFERON TB GOLD ASSAY (BLOOD)
INTERFERON GAMMA RELEASE ASSAY: NEGATIVE
Mitogen-Nil: >10 IU/mL
QUANTIFERON NIL VALUE: 0.03 [IU]/mL
Quantiferon Tb Ag Minus Nil Value: 0.03 IU/mL

## 2016-10-08 NOTE — Progress Notes (Signed)
Labs are normal. Okay to refill medications as needed

## 2016-10-11 ENCOUNTER — Telehealth: Payer: Self-pay | Admitting: Pharmacist

## 2016-10-11 MED ORDER — METHOTREXATE SODIUM 2.5 MG PO TABS: 12.5000 mg | ORAL_TABLET | ORAL | 0 refills | Status: DC

## 2016-10-11 MED ORDER — FOLIC ACID 1 MG PO TABS: 1.0000 mg | ORAL_TABLET | Freq: Every day | ORAL | 3 refills | Status: DC

## 2016-10-11 NOTE — Telephone Encounter (Signed)
-----   Message from Carole Binning, LPN sent at 09/08/2809  9:44 AM EDT -----   ----- Message ----- From: Bo Merino, MD Sent: 10/08/2016   8:08 PM To: [facility] Clinical  Labs are normal. Okay to refill medications as needed

## 2016-10-11 NOTE — Telephone Encounter (Signed)
Informed patient of lab results.  Patient confirms he does need refill of methotrexate.  Refill sent to [facility].

## 2016-11-01 ENCOUNTER — Ambulatory Visit: Payer: BLUE CROSS/BLUE SHIELD | Admitting: Rheumatology

## 2016-12-22 ENCOUNTER — Other Ambulatory Visit: Payer: Self-pay

## 2016-12-22 DIAGNOSIS — Z79899 Other long term (current) drug therapy: Secondary | ICD-10-CM

## 2016-12-22 LAB — COMPLETE METABOLIC PANEL WITH GFR
ALBUMIN: 4.2 g/dL (ref 3.6–5.1)
ALT: 64 U/L — ABNORMAL HIGH (ref 9–46)
AST: 40 U/L (ref 10–40)
Alkaline Phosphatase: 80 U/L (ref 40–115)
BUN: 11 mg/dL (ref 7–25)
CO2: 28 mmol/L (ref 20–31)
CREATININE: 0.99 mg/dL (ref 0.60–1.35)
Calcium: 9.3 mg/dL (ref 8.6–10.3)
Chloride: 102 mmol/L (ref 98–110)
GFR, Est African American: >89 mL/min (ref >=60)
GFR, Est Non African American: >89 mL/min (ref >=60)
Glucose, Bld: 70 mg/dL (ref 65–99)
Potassium: 5.1 mmol/L (ref 3.5–5.3)
SODIUM: 139 mmol/L (ref 135–146)
Total Bilirubin: 0.7 mg/dL (ref 0.2–1.2)
Total Protein: 6.6 g/dL (ref 6.1–8.1)

## 2016-12-22 LAB — CBC WITH DIFFERENTIAL/PLATELET
BASOS ABS: 64 {cells}/uL (ref 0–200)
BASOS PCT: 1 %
EOS PCT: 1 %
Eosinophils Absolute: 64 cells/uL (ref 15–500)
HCT: 44.4 % (ref 38.5–50.0)
HEMOGLOBIN: 15.3 g/dL (ref 13.2–17.1)
LYMPHS ABS: 1664 {cells}/uL (ref 850–3900)
Lymphocytes Relative: 26 %
MCH: 31.6 pg (ref 27.0–33.0)
MCHC: 34.5 g/dL (ref 32.0–36.0)
MCV: 91.7 fL (ref 80.0–100.0)
MPV: 9.8 fL (ref 7.5–12.5)
Monocytes Absolute: 384 cells/uL (ref 200–950)
Monocytes Relative: 6 %
NEUTROS ABS: 4224 {cells}/uL (ref 1500–7800)
Neutrophils Relative %: 66 %
Platelets: 262 10*3/uL (ref 140–400)
RBC: 4.84 MIL/uL (ref 4.20–5.80)
RDW: 14.2 % (ref 11.0–15.0)
WBC: 6.4 10*3/uL (ref 3.8–10.8)

## 2016-12-23 NOTE — Progress Notes (Signed)
LFTs increased. Please discuss avoiding NSAIDS and alcohol use. Will follow labs.

## 2017-03-07 NOTE — Progress Notes (Signed)
Office Visit Note  Patient: Rick Santiago             Date of Birth: Apr 17, 1976           MRN: [id number]             PCP: [name], MD Referring: [name], MD Visit Date: 03/09/2017 Occupation: @GUAROCC @    Subjective:  Medication management.   History of Present Illness: Rick Santiago is a 41 y.o. male  with history of psoriatic arthritis and psoriasis. He states he is doing quite well without any increased joint pain or joint swelling. He states his psoriasis is fairly well controlled. He has some patches occasionally. He is quite satisfied with methotrexate. He believes the elevation of LFTs was related to using anti-inflammatories which she was taking for sinus infection. He does not drink any alcohol.  Activities of Daily Living:  Patient reports morning stiffness for 5 minutes.   Patient Denies nocturnal pain.  Difficulty dressing/grooming: Denies Difficulty climbing stairs: Denies Difficulty getting out of chair: Denies Difficulty using hands for taps, buttons, cutlery, and/or writing: Denies   Review of Systems  Constitutional: Negative for fatigue, night sweats and weakness ( ).  HENT: Negative for mouth sores, mouth dryness and nose dryness.   Eyes: Negative for redness and dryness.  Respiratory: Negative for shortness of breath and difficulty breathing.   Cardiovascular: Negative for chest pain, palpitations, hypertension, irregular heartbeat and swelling in legs/feet.  Gastrointestinal: Negative for constipation and diarrhea.  Endocrine: Negative for increased urination.  Musculoskeletal: Negative for arthralgias, joint pain, joint swelling, myalgias, muscle weakness, morning stiffness, muscle tenderness and myalgias.  Skin: Positive for rash. Negative for color change, hair loss, nodules/bumps, skin tightness, ulcers and sensitivity to sunlight.  Allergic/Immunologic: Negative for susceptible to infections.  Neurological: Negative for dizziness,  fainting, memory loss and night sweats.  Hematological: Negative for swollen glands.  Psychiatric/Behavioral: Negative for depressed mood and sleep disturbance. The patient is not nervous/anxious.     PMFS History:  Patient Active Problem List   Diagnosis Date Noted  . Plantar fasciitis 09/29/2016  . Psoriasis 09/29/2016  . High risk medication use 09/29/2016  . Psoriatic arthritis ([place]) 09/14/2016  . FHx: hemochromatosis 09/14/2016  . Encounter for methotrexate monitoring 09/14/2016  . Family history of diabetes mellitus in mother 09/14/2016    Past Medical History:  Diagnosis Date  . Arthritis   . Psoriasis     Family History  Problem Relation Age of Onset  . Diabetes Mother   . Heart disease Father    History reviewed. No pertinent surgical history. Social History   Social History Narrative  . No narrative on file     Objective: Vital Signs: BP 114/87 (BP Location: Left Arm, Patient Position: Sitting, Cuff Size: Normal)   Pulse 62   Ht 5' 7"  (1.702 m)   Wt 141 lb (64 kg)   BMI 22.08 kg/m    Physical Exam  Constitutional: He is oriented to person, place, and time. He appears well-developed and well-nourished.  HENT:  Head: Normocephalic and atraumatic.  Eyes: Pupils are equal, round, and reactive to light. Conjunctivae and EOM are normal.  Neck: Normal range of motion. Neck supple.  Cardiovascular: Normal rate, regular rhythm and normal heart sounds.   Pulmonary/Chest: Effort normal and breath sounds normal.  Abdominal: Soft. Bowel sounds are normal.  Neurological: He is alert and oriented to person, place, and time.  Skin: Skin is warm and dry. Capillary refill  takes less than 2 seconds. Rash noted.  Psoriasis patches on the scalp, left elbow, right shin, umbilical area and back  Psychiatric: He has a normal mood and affect. His behavior is normal.  Nursing note and vitals reviewed.    Musculoskeletal Exam:  C-spine and thoracic lumbar spine good range of  motion . Shoulder joints, elbow joints, wrist joints, MCPs PIPs DIPs were good range of motion with no synovitis. Hip joints knee joints ankles MTPs PIPs DIPs with good range of motion with no synovitis.  CDAI Exam: CDAI Homunculus Exam:   Joint Counts:  CDAI Tender Joint count: 0 CDAI Swollen Joint count: 0  Global Assessments:  Patient Global Assessment: 1 Provider Global Assessment: 1  CDAI Calculated Score: 2    Investigation: No additional findings. CBC Latest Ref Rng & Units 12/22/2016 09/14/2016  WBC 3.8 - 10.8 K/uL 6.4 5.1  Hemoglobin 13.2 - 17.1 g/dL 15.3 15.9  Hematocrit 38.5 - 50.0 % 44.4 44.7  Platelets 140 - 400 K/uL 262 265   CMP Latest Ref Rng & Units 12/22/2016 09/14/2016  Glucose 65 - 99 mg/dL 70 92  BUN 7 - 25 mg/dL 11 8  Creatinine 0.60 - 1.35 mg/dL 0.99 0.97  Sodium 135 - 146 mmol/L 139 142  Potassium 3.5 - 5.3 mmol/L 5.1 4.4  Chloride 98 - 110 mmol/L 102 102  CO2 20 - 31 mmol/L 28 22  Calcium 8.6 - 10.3 mg/dL 9.3 9.4  Total Protein 6.1 - 8.1 g/dL 6.6 7.2  Total Bilirubin 0.2 - 1.2 mg/dL 0.7 0.7  Alkaline Phos 40 - 115 U/L 80 83  AST 10 - 40 U/L 40 22  ALT 9 - 46 U/L 64(H) 24    Imaging: No results found.  Speciality Comments: No specialty comments available.    Procedures:  No procedures performed Allergies: Patient has no known allergies.   Assessment / Plan:     Visit Diagnoses: Psoriatic arthritis [facility]): Patient has no active synovitis on examination.   Psoriasis: He still has several psoriasis patches on extremities and trunk. I offered topical therapy but he declined. He states he does see dermatologist. I discussed the option of adding biologic DMARD's but patient declined. He is very much concerned about the side effects of biologic DMARD's or any other new medication. He states in future if he has elevated LFTs seem consider Otezla approved.  Plantar fasciitis: Doing better with orthotics.  Elevated LFTs: He believes LFT elevation was  due to use of anti-inflammatories. We will repeat his labs today.  High risk medication use - Methotrexate 5 tablets by mouth every week, folic acid 1 mg by mouth daily - Plan: CBC with Differential/Platelet, COMPLETE METABOLIC PANEL WITH GFR    Orders: No orders of the defined types were placed in this encounter.  No orders of the defined types were placed in this encounter.   Face-to-face time spent with patient was 30 minutes.Greater than 50% of time was spent in counseling and coordination of care.  Follow-Up Instructions: Return in about 5 months (around 08/09/2017) for Psoriatic arthritis.   Bo Merino, MD  Note - This record has been created using Editor, commissioning.  Chart creation errors have been sought, but may not always  have been located. Such creation errors do not reflect on  the standard of medical care.

## 2017-03-09 ENCOUNTER — Ambulatory Visit (INDEPENDENT_AMBULATORY_CARE_PROVIDER_SITE_OTHER): Payer: BC Managed Care – PPO | Admitting: Rheumatology

## 2017-03-09 ENCOUNTER — Encounter (INDEPENDENT_AMBULATORY_CARE_PROVIDER_SITE_OTHER): Payer: Self-pay

## 2017-03-09 ENCOUNTER — Encounter: Payer: Self-pay | Admitting: Rheumatology

## 2017-03-09 VITALS — BP 114/87 | HR 62 | Ht 67.0 in | Wt 141.0 lb

## 2017-03-09 DIAGNOSIS — Z79899 Other long term (current) drug therapy: Secondary | ICD-10-CM | POA: Diagnosis not present

## 2017-03-09 DIAGNOSIS — R7989 Other specified abnormal findings of blood chemistry: Secondary | ICD-10-CM | POA: Diagnosis not present

## 2017-03-09 DIAGNOSIS — M722 Plantar fascial fibromatosis: Secondary | ICD-10-CM

## 2017-03-09 DIAGNOSIS — L4050 Arthropathic psoriasis, unspecified: Secondary | ICD-10-CM

## 2017-03-09 DIAGNOSIS — L409 Psoriasis, unspecified: Secondary | ICD-10-CM | POA: Diagnosis not present

## 2017-03-09 DIAGNOSIS — R945 Abnormal results of liver function studies: Secondary | ICD-10-CM

## 2017-03-09 LAB — CBC WITH DIFFERENTIAL/PLATELET
BASOS PCT: 0 %
Basophils Absolute: 0 cells/uL (ref 0–200)
EOS ABS: 65 {cells}/uL (ref 15–500)
Eosinophils Relative: 1 %
HEMATOCRIT: 48.1 % (ref 38.5–50.0)
Hemoglobin: 16.8 g/dL (ref 13.2–17.1)
Lymphocytes Relative: 31 %
Lymphs Abs: 2015 cells/uL (ref 850–3900)
MCH: 32.2 pg (ref 27.0–33.0)
MCHC: 34.9 g/dL (ref 32.0–36.0)
MCV: 92.1 fL (ref 80.0–100.0)
MONO ABS: 325 {cells}/uL (ref 200–950)
MONOS PCT: 5 %
MPV: 9.8 fL (ref 7.5–12.5)
NEUTROS ABS: 4095 {cells}/uL (ref 1500–7800)
Neutrophils Relative %: 63 %
PLATELETS: 250 10*3/uL (ref 140–400)
RBC: 5.22 MIL/uL (ref 4.20–5.80)
RDW: 13.9 % (ref 11.0–15.0)
WBC: 6.5 10*3/uL (ref 3.8–10.8)

## 2017-03-09 NOTE — Patient Instructions (Signed)
Standing Labs We placed an order today for your standing lab work.    Please come back and get your standing labs in December and every 3 months  We have open lab Monday through Friday from 8:30-11:30 AM and 1:30-4 PM at the office of Dr. Bo Merino.   The office is located at [address], [place], [place], [place] [address] No appointment is necessary.   Labs are drawn by [facility].  You may receive a bill from [facility] for your lab work. If you have any questions regarding directions or hours of operation,  please call [phone].

## 2017-03-10 LAB — COMPLETE METABOLIC PANEL WITH GFR
ALT: 33 U/L (ref 9–46)
AST: 25 U/L (ref 10–40)
Albumin: 4.8 g/dL (ref 3.6–5.1)
Alkaline Phosphatase: 85 U/L (ref 40–115)
BILIRUBIN TOTAL: 0.7 mg/dL (ref 0.2–1.2)
BUN: 9 mg/dL (ref 7–25)
CO2: 22 mmol/L (ref 20–32)
CREATININE: 0.96 mg/dL (ref 0.60–1.35)
Calcium: 9.9 mg/dL (ref 8.6–10.3)
Chloride: 103 mmol/L (ref 98–110)
GFR, Est Non African American: >89 mL/min (ref >=60)
GLUCOSE: 79 mg/dL (ref 65–99)
Potassium: 4.8 mmol/L (ref 3.5–5.3)
Sodium: 140 mmol/L (ref 135–146)
TOTAL PROTEIN: 7.2 g/dL (ref 6.1–8.1)

## 2017-03-10 NOTE — Progress Notes (Signed)
WNLs

## 2017-06-14 ENCOUNTER — Other Ambulatory Visit: Payer: Self-pay | Admitting: Rheumatology

## 2017-06-15 ENCOUNTER — Telehealth: Payer: Self-pay | Admitting: Rheumatology

## 2017-06-15 ENCOUNTER — Other Ambulatory Visit: Payer: Self-pay

## 2017-06-15 DIAGNOSIS — Z79899 Other long term (current) drug therapy: Secondary | ICD-10-CM

## 2017-06-15 LAB — CBC WITH DIFFERENTIAL/PLATELET
BASOS ABS: 50 {cells}/uL (ref 0–200)
BASOS PCT: 0.9 %
EOS PCT: 0.9 %
Eosinophils Absolute: 50 cells/uL (ref 15–500)
HEMATOCRIT: 46 % (ref 38.5–50.0)
HEMOGLOBIN: 15.7 g/dL (ref 13.2–17.1)
LYMPHS ABS: 1641 {cells}/uL (ref 850–3900)
MCH: 31.4 pg (ref 27.0–33.0)
MCHC: 34.1 g/dL (ref 32.0–36.0)
MCV: 92 fL (ref 80.0–100.0)
MPV: 10.6 fL (ref 7.5–12.5)
Monocytes Relative: 7.6 %
NEUTROS ABS: 3433 {cells}/uL (ref 1500–7800)
Neutrophils Relative %: 61.3 %
Platelets: 257 10*3/uL (ref 140–400)
RBC: 5 10*6/uL (ref 4.20–5.80)
RDW: 12.9 % (ref 11.0–15.0)
Total Lymphocyte: 29.3 %
WBC mixed population: 426 cells/uL (ref 200–950)
WBC: 5.6 10*3/uL (ref 3.8–10.8)

## 2017-06-15 LAB — COMPLETE METABOLIC PANEL WITH GFR
AG Ratio: 1.8 (calc) (ref 1.0–2.5)
ALT: 50 U/L — AB (ref 9–46)
AST: 28 U/L (ref 10–40)
Albumin: 4.6 g/dL (ref 3.6–5.1)
Alkaline phosphatase (APISO): 77 U/L (ref 40–115)
BILIRUBIN TOTAL: 0.9 mg/dL (ref 0.2–1.2)
BUN: 9 mg/dL (ref 7–25)
CALCIUM: 9.9 mg/dL (ref 8.6–10.3)
CHLORIDE: 103 mmol/L (ref 98–110)
CO2: 28 mmol/L (ref 20–32)
Creat: 0.96 mg/dL (ref 0.60–1.35)
GFR, EST AFRICAN AMERICAN: 112 mL/min/{1.73_m2} (ref >=60)
GFR, Est Non African American: 96 mL/min/{1.73_m2} (ref >=60)
GLUCOSE: 92 mg/dL (ref 65–99)
Globulin: 2.5 g/dL (calc) (ref 1.9–3.7)
Potassium: 4.7 mmol/L (ref 3.5–5.3)
Sodium: 138 mmol/L (ref 135–146)
TOTAL PROTEIN: 7.1 g/dL (ref 6.1–8.1)

## 2017-06-15 NOTE — Telephone Encounter (Signed)
Last Visit: 03/09/17 Next Visit due January 2019. Message sent to front to schedule patient  Labs: 03/09/17 WNL  Patient states he will update labs this week.   Okay to refill per 30 day day supply per Dr. Estanislado Pandy

## 2017-06-15 NOTE — Telephone Encounter (Signed)
Patient has been advised he is due for labs and a 30 day supply has been sent to the pharmacy.

## 2017-06-15 NOTE — Telephone Encounter (Signed)
Patient states he does not have a refill on rx for MTX 2.58m, and he is needs med refill. [facility] uses [facility] [address]

## 2017-07-14 ENCOUNTER — Other Ambulatory Visit: Payer: Self-pay | Admitting: Rheumatology

## 2017-07-14 NOTE — Telephone Encounter (Signed)
Last Visit: 03/09/17 Next Visit: 11/08/17 Labs: 06/15/17 Mildly elevated AST at 50 previously normal  Okay to refill MTX?

## 2017-07-14 NOTE — Telephone Encounter (Signed)
Patient advised.

## 2017-07-14 NOTE — Telephone Encounter (Signed)
LFTs are elevated. We will continue to monitor. Please advise Pt.

## 2017-08-17 NOTE — Progress Notes (Signed)
Office Visit Note  Patient: Rick Santiago             Date of Birth: Aug 06, 1975           MRN: [id number]             PCP: [name], MD Referring: [name], MD Visit Date: 08/22/2017 Occupation: @GUAROCC @    Subjective:  Medication monitoring    History of Present Illness: Kevron Patella is a 42 y.o. male with history of psoriatic arthritis and plantar fasciitis bilaterally.  Patient states that he continues to take MTX 5 tablets once weekly and folic acid 1 mg daily.  Patient states he would like to see if he can get a 90 day supply of his medications.  Patient denies any joint pain or joint swelling at this time.  He denies any morning stiffness in his joint.  He states that since he was initially diagnosed with psoriatic arthritis he has noticed decreased grip strength.  he states that functionally he has able to do everything he wants.  He states that his bilateral plantar fasciitis has improved significantly since wearing orthotics.  He denies any SI joint pain or achilles tendonitis.  He continues to have psoriasis on his left elbow and anterior surface of his right lower leg.  He states that his psoriasis is unchanged and not worsening.  He uses vasoline on his psoriasis and eczema.  He also uses Dr. Lurlean Nanny magic soap for his scalp.       Activities of Daily Living:  Patient reports morning stiffness for 0  minutes.   Patient Denies nocturnal pain.  Difficulty dressing/grooming: Denies Difficulty climbing stairs: Denies Difficulty getting out of chair: Denies Difficulty using hands for taps, buttons, cutlery, and/or writing: Denies   Review of Systems  Constitutional: Negative for fatigue, night sweats and weakness.  HENT: Negative for mouth sores, mouth dryness and nose dryness.   Eyes: Negative for pain, redness, visual disturbance and dryness.  Respiratory: Negative for cough, hemoptysis, shortness of breath and difficulty breathing.   Cardiovascular:  Negative for chest pain, palpitations, hypertension, irregular heartbeat and swelling in legs/feet.  Gastrointestinal: Negative for blood in stool, constipation and diarrhea.  Endocrine: Negative for increased urination.  Genitourinary: Negative for painful urination.  Musculoskeletal: Negative for arthralgias, joint pain, joint swelling, myalgias, muscle weakness, morning stiffness, muscle tenderness and myalgias.  Skin: Positive for rash (Psoriasis ). Negative for color change, hair loss, nodules/bumps, skin tightness, ulcers and sensitivity to sunlight.  Allergic/Immunologic: Negative for susceptible to infections.  Neurological: Negative for dizziness, fainting, memory loss and night sweats.  Hematological: Negative for swollen glands.  Psychiatric/Behavioral: Negative for depressed mood and sleep disturbance. The patient is not nervous/anxious.     PMFS History:  Patient Active Problem List   Diagnosis Date Noted  . Plantar fasciitis 09/29/2016  . Psoriasis 09/29/2016  . High risk medication use 09/29/2016  . Psoriatic arthritis ([place]) 09/14/2016  . FHx: hemochromatosis 09/14/2016  . Encounter for methotrexate monitoring 09/14/2016  . Family history of diabetes mellitus in mother 09/14/2016    Past Medical History:  Diagnosis Date  . Arthritis   . Psoriasis     Family History  Problem Relation Age of Onset  . Diabetes Mother   . Heart disease Father   . Irritable bowel syndrome Daughter    History reviewed. No pertinent surgical history. Social History   Social History Narrative  . Not on file     Objective: Vital  Signs: BP 115/79 (BP Location: Left Arm, Patient Position: Sitting, Cuff Size: Normal)   Pulse 64   Resp 15   Ht 5' 6.5" (1.689 m)   Wt 142 lb (64.4 kg)   BMI 22.58 kg/m    Physical Exam  Constitutional: He is oriented to person, place, and time. He appears well-developed and well-nourished.  HENT:  Head: Normocephalic and atraumatic.  Eyes:  Conjunctivae and EOM are normal. Pupils are equal, round, and reactive to light.  Neck: Normal range of motion. Neck supple.  Cardiovascular: Normal rate, regular rhythm and normal heart sounds.  Pulmonary/Chest: Effort normal and breath sounds normal.  Abdominal: Soft. Bowel sounds are normal.  Neurological: He is alert and oriented to person, place, and time.  Skin: Skin is warm and dry. Capillary refill takes less than 2 seconds.  Psychiatric: He has a normal mood and affect. His behavior is normal.  Nursing note and vitals reviewed.    Musculoskeletal Exam: C-spine, thoracic, and lumbar spine good ROM.  No midline spinal tenderness.  No SI joint tenderness.  Shoulder joints, elbow joints, wrist joints, MCPs, PIPs, and DIPs good ROM with no synovitis.  He has mild PIP and DIP synovial thickening.  Hip joints, knee joints, ankle joints, MTPs, PIPs, and DIPs good ROM with no synovitis.  No warmth or effusion of knees.  He has right knee crepitus.  No achilles tendonitis.    CDAI Exam: CDAI Homunculus Exam:   Joint Counts:  CDAI Tender Joint count: 0 CDAI Swollen Joint count: 0  Global Assessments:  Patient Global Assessment: 1 Provider Global Assessment: 1  CDAI Calculated Score: 2    Investigation: No additional findings.  CBC Latest Ref Rng & Units 06/15/2017 03/09/2017 12/22/2016  WBC 3.8 - 10.8 Thousand/uL 5.6 6.5 6.4  Hemoglobin 13.2 - 17.1 g/dL 15.7 16.8 15.3  Hematocrit 38.5 - 50.0 % 46.0 48.1 44.4  Platelets 140 - 400 Thousand/uL 257 250 262   CMP Latest Ref Rng & Units 06/15/2017 03/09/2017 12/22/2016  Glucose 65 - 99 mg/dL 92 79 70  BUN 7 - 25 mg/dL 9 9 11   Creatinine 0.60 - 1.35 mg/dL 0.96 0.96 0.99  Sodium 135 - 146 mmol/L 138 140 139  Potassium 3.5 - 5.3 mmol/L 4.7 4.8 5.1  Chloride 98 - 110 mmol/L 103 103 102  CO2 20 - 32 mmol/L 28 22 28   Calcium 8.6 - 10.3 mg/dL 9.9 9.9 9.3  Total Protein 6.1 - 8.1 g/dL 7.1 7.2 6.6  Total Bilirubin 0.2 - 1.2 mg/dL 0.9 0.7 0.7    Alkaline Phos 40 - 115 U/L - 85 80  AST 10 - 40 U/L 28 25 40  ALT 9 - 46 U/L 50(H) 33 64(H)   Imaging: No results found.  Speciality Comments: No specialty comments available.    Procedures:  No procedures performed Allergies: Patient has no known allergies.   Assessment / Plan:     Visit Diagnoses: Psoriatic arthritis ([place]): He has no synovitis or dactylitis on exam.  He has no tenderness or discomfort of his joints.  No morning stiffness. He is clinically doing very well on MTX 5 tablets once a week and folic acid 1 mg daily.  He was given a refill of these medications.    Psoriasis: He continues to have active psoriasis on his left elbow and anterior surface of his right lower extremity.  He uses vasoline on these areas, which helps.  He would not like to start another medication at this  time.   High risk medication use - MTX 5 tablets weekly and folic acid 1 mg, labs: 06/15/17.  ALT mildly elevated.  We will continue to monitor his labs.  Standing orders are in place.  He will return in March 2019 for labs and every 3 months.   Plantar fasciitis bilaterally: Improved.  He wears orthotics in his shoes daily.    FHx: hemochromatosis    Orders: No orders of the defined types were placed in this encounter.  No orders of the defined types were placed in this encounter.    Follow-Up Instructions: Return in about 5 months (around 01/19/2018) for Psoriatic arthritis.   Ofilia Neas, PA-C  Note - This record has been created using Dragon software.  Chart creation errors have been sought, but may not always  have been located. Such creation errors do not reflect on  the standard of medical care.

## 2017-08-22 ENCOUNTER — Ambulatory Visit: Payer: BC Managed Care – PPO | Admitting: Physician Assistant

## 2017-08-22 ENCOUNTER — Encounter: Payer: Self-pay | Admitting: Physician Assistant

## 2017-08-22 VITALS — BP 115/79 | HR 64 | Resp 15 | Ht 66.5 in | Wt 142.0 lb

## 2017-08-22 DIAGNOSIS — M722 Plantar fascial fibromatosis: Secondary | ICD-10-CM

## 2017-08-22 DIAGNOSIS — Z79899 Other long term (current) drug therapy: Secondary | ICD-10-CM | POA: Diagnosis not present

## 2017-08-22 DIAGNOSIS — L4050 Arthropathic psoriasis, unspecified: Secondary | ICD-10-CM

## 2017-08-22 DIAGNOSIS — Z8349 Family history of other endocrine, nutritional and metabolic diseases: Secondary | ICD-10-CM | POA: Diagnosis not present

## 2017-08-22 DIAGNOSIS — L409 Psoriasis, unspecified: Secondary | ICD-10-CM

## 2017-08-22 MED ORDER — FOLIC ACID 1 MG PO TABS: 1.0000 mg | ORAL_TABLET | Freq: Every day | ORAL | 3 refills | Status: DC

## 2017-08-22 MED ORDER — METHOTREXATE 2.5 MG PO TABS: ORAL_TABLET | ORAL | 0 refills | Status: DC

## 2017-08-22 NOTE — Patient Instructions (Signed)
Standing Labs We placed an order today for your standing lab work.    Please come back and get your standing labs in March and every 3 months  We have open lab Monday through Friday from 8:30-11:30 AM and 1:30-4 PM at the office of Dr. Bo Merino.   The office is located at [address] [place], [place], [place] [address] No appointment is necessary.   Labs are drawn by [facility].  You may receive a bill from [facility] for your lab work. If you have any questions regarding directions or hours of operation,  please call [phone].

## 2017-09-21 ENCOUNTER — Other Ambulatory Visit: Payer: Self-pay | Admitting: *Deleted

## 2017-09-21 DIAGNOSIS — Z79899 Other long term (current) drug therapy: Secondary | ICD-10-CM

## 2017-09-22 LAB — COMPLETE METABOLIC PANEL WITH GFR
AG RATIO: 2.2 (calc) (ref 1.0–2.5)
ALT: 26 U/L (ref 9–46)
AST: 23 U/L (ref 10–40)
Albumin: 4.4 g/dL (ref 3.6–5.1)
Alkaline phosphatase (APISO): 101 U/L (ref 40–115)
BILIRUBIN TOTAL: 0.5 mg/dL (ref 0.2–1.2)
BUN: 8 mg/dL (ref 7–25)
CHLORIDE: 106 mmol/L (ref 98–110)
CO2: 29 mmol/L (ref 20–32)
Calcium: 9.3 mg/dL (ref 8.6–10.3)
Creat: 1 mg/dL (ref 0.60–1.35)
GFR, EST AFRICAN AMERICAN: 106 mL/min/{1.73_m2} (ref >=60)
GFR, Est Non African American: 92 mL/min/{1.73_m2} (ref >=60)
Globulin: 2 g/dL (calc) (ref 1.9–3.7)
Glucose, Bld: 83 mg/dL (ref 65–99)
Potassium: 4.2 mmol/L (ref 3.5–5.3)
Sodium: 139 mmol/L (ref 135–146)
TOTAL PROTEIN: 6.4 g/dL (ref 6.1–8.1)

## 2017-09-22 LAB — CBC WITH DIFFERENTIAL/PLATELET
BASOS PCT: 0.6 %
Basophils Absolute: 41 cells/uL (ref 0–200)
EOS ABS: 21 {cells}/uL (ref 15–500)
Eosinophils Relative: 0.3 %
HEMATOCRIT: 42.7 % (ref 38.5–50.0)
HEMOGLOBIN: 15.3 g/dL (ref 13.2–17.1)
LYMPHS ABS: 1670 {cells}/uL (ref 850–3900)
MCH: 32.6 pg (ref 27.0–33.0)
MCHC: 35.8 g/dL (ref 32.0–36.0)
MCV: 90.9 fL (ref 80.0–100.0)
MPV: 11.3 fL (ref 7.5–12.5)
Monocytes Relative: 7.2 %
NEUTROS ABS: 4671 {cells}/uL (ref 1500–7800)
Neutrophils Relative %: 67.7 %
Platelets: 265 10*3/uL (ref 140–400)
RBC: 4.7 10*6/uL (ref 4.20–5.80)
RDW: 13.3 % (ref 11.0–15.0)
Total Lymphocyte: 24.2 %
WBC: 6.9 10*3/uL (ref 3.8–10.8)
WBCMIX: 497 {cells}/uL (ref 200–950)

## 2017-11-08 ENCOUNTER — Ambulatory Visit: Payer: BC Managed Care – PPO | Admitting: Rheumatology

## 2017-12-11 ENCOUNTER — Other Ambulatory Visit: Payer: Self-pay | Admitting: Physician Assistant

## 2017-12-11 NOTE — Telephone Encounter (Signed)
Last Visit: 08/22/17 Next Visit: 01/30/18 Labs: 09/21/17 WNL  Left message to advise patient he is due for labs.    Okay to refill 30 day supply per Dr. Estanislado Pandy

## 2018-01-03 ENCOUNTER — Other Ambulatory Visit: Payer: Self-pay

## 2018-01-03 DIAGNOSIS — Z79899 Other long term (current) drug therapy: Secondary | ICD-10-CM

## 2018-01-03 LAB — COMPLETE METABOLIC PANEL WITH GFR
AG RATIO: 1.9 (calc) (ref 1.0–2.5)
ALBUMIN MSPROF: 4.5 g/dL (ref 3.6–5.1)
ALT: 32 U/L (ref 9–46)
AST: 24 U/L (ref 10–40)
Alkaline phosphatase (APISO): 96 U/L (ref 40–115)
BUN: 11 mg/dL (ref 7–25)
CALCIUM: 9.4 mg/dL (ref 8.6–10.3)
CO2: 31 mmol/L (ref 20–32)
CREATININE: 0.96 mg/dL (ref 0.60–1.35)
Chloride: 103 mmol/L (ref 98–110)
GFR, EST NON AFRICAN AMERICAN: 96 mL/min/{1.73_m2} (ref >=60)
GFR, Est African American: 112 mL/min/{1.73_m2} (ref >=60)
GLOBULIN: 2.4 g/dL (ref 1.9–3.7)
Glucose, Bld: 90 mg/dL (ref 65–99)
POTASSIUM: 4.5 mmol/L (ref 3.5–5.3)
SODIUM: 139 mmol/L (ref 135–146)
Total Bilirubin: 0.8 mg/dL (ref 0.2–1.2)
Total Protein: 6.9 g/dL (ref 6.1–8.1)

## 2018-01-03 LAB — CBC WITH DIFFERENTIAL/PLATELET
Basophils Absolute: 52 cells/uL (ref 0–200)
Basophils Relative: 1 %
EOS PCT: 1.2 %
Eosinophils Absolute: 62 cells/uL (ref 15–500)
HCT: 44.5 % (ref 38.5–50.0)
Hemoglobin: 15.1 g/dL (ref 13.2–17.1)
LYMPHS ABS: 1518 {cells}/uL (ref 850–3900)
MCH: 32.3 pg (ref 27.0–33.0)
MCHC: 33.9 g/dL (ref 32.0–36.0)
MCV: 95.3 fL (ref 80.0–100.0)
MONOS PCT: 6.2 %
MPV: 11.3 fL (ref 7.5–12.5)
NEUTROS PCT: 62.4 %
Neutro Abs: 3245 cells/uL (ref 1500–7800)
PLATELETS: 228 10*3/uL (ref 140–400)
RBC: 4.67 10*6/uL (ref 4.20–5.80)
RDW: 13 % (ref 11.0–15.0)
Total Lymphocyte: 29.2 %
WBC mixed population: 322 cells/uL (ref 200–950)
WBC: 5.2 10*3/uL (ref 3.8–10.8)

## 2018-01-04 ENCOUNTER — Telehealth: Payer: Self-pay | Admitting: *Deleted

## 2018-01-04 MED ORDER — METHOTREXATE 2.5 MG PO TABS: 12.5000 mg | ORAL_TABLET | ORAL | 0 refills | Status: DC

## 2018-01-04 NOTE — Telephone Encounter (Signed)
-----   Message from Harlon Flor, RT sent at 01/04/2018 11:58 AM EDT ----- Notified Rick Santiago of lab results. Would like methotroxate called in for 90 day supply. ----- Message ----- From: Carole Binning, LPN Sent: [id number]/47/3403   8:53 AM To: Harlon Flor, RT

## 2018-01-04 NOTE — Telephone Encounter (Signed)
Last Visit: 08/22/17 Next Visit: 01/30/18 Labs: 01/03/18 WNL  Okay to refill per Dr. Estanislado Pandy

## 2018-01-16 NOTE — Progress Notes (Signed)
Office Visit Note  Patient: Rick Santiago             Date of Birth: [birth year]           MRN: [id number]             PCP: [name], MD Referring: [name], MD Visit Date: 01/30/2018 Occupation: @GUAROCC @    Subjective:  Medication monitoring   History of Present Illness: Rick Santiago is a 42 y.o. male with history of psoriatic arthritis and plantar fasciitis.  Patient is on methotrexate 5 tablets by mouth once weekly.  He has not been compliant with his folic acid and takes it about once a week.  He denies any recent psoriatic arthritis flares.  He denies any increased joint pain or joint swelling.  He states that his psoriasis has been stable he continues to have a few scattered patches.  He states he uses Vaseline and would not like anything prescription strength.  He states that he wears insoles in his bilateral shoes for management of plantar fasciitis which is improved.  He denies any Achilles tendinitis.  He states that about 1 week ago he was riding in the car up to [place] and developed right SI joint pain which resolved 2 to 3 days later.  He denies missing any doses of methotrexate.  He denies needing any refills at this time.  Activities of Daily Living:  Patient reports morning stiffness for 0 minutes.   Patient Denies nocturnal pain.  Difficulty dressing/grooming: Denies Difficulty climbing stairs: Denies Difficulty getting out of chair: Denies Difficulty using hands for taps, buttons, cutlery, and/or writing: Denies   Review of Systems  Constitutional: Negative for fatigue and night sweats.  HENT: Negative for mouth sores, trouble swallowing, trouble swallowing, mouth dryness and nose dryness.   Eyes: Negative for redness, visual disturbance and dryness.  Respiratory: Negative for cough, hemoptysis, shortness of breath and difficulty breathing.   Cardiovascular: Negative for chest pain, palpitations, hypertension, irregular heartbeat and swelling in  legs/feet.  Gastrointestinal: Negative for abdominal pain, constipation and diarrhea.  Endocrine: Negative for increased urination.  Genitourinary: Negative for painful urination and pelvic pain.  Musculoskeletal: Positive for arthralgias and joint pain. Negative for joint swelling, myalgias, muscle weakness, morning stiffness, muscle tenderness and myalgias.  Skin: Negative for color change, rash, hair loss, nodules/bumps, skin tightness, ulcers and sensitivity to sunlight.  Allergic/Immunologic: Negative for susceptible to infections.  Neurological: Negative for dizziness, fainting, light-headedness, headaches, memory loss, night sweats and weakness.  Hematological: Negative for bruising/bleeding tendency and swollen glands.  Psychiatric/Behavioral: Negative for depressed mood, confusion and sleep disturbance. The patient is not nervous/anxious.     PMFS History:  Patient Active Problem List   Diagnosis Date Noted  . Plantar fasciitis 09/29/2016  . Psoriasis 09/29/2016  . High risk medication use 09/29/2016  . Psoriatic arthritis ([place]) 09/14/2016  . FHx: hemochromatosis 09/14/2016  . Encounter for methotrexate monitoring 09/14/2016  . Family history of diabetes mellitus in mother 09/14/2016    Past Medical History:  Diagnosis Date  . Arthritis   . Psoriasis     Family History  Problem Relation Age of Onset  . Diabetes Mother   . Heart disease Father   . Irritable bowel syndrome Daughter    History reviewed. No pertinent surgical history. Social History   Social History Narrative  . Not on file     Objective: Vital Signs: BP 115/77 (BP Location: Left Arm, Patient Position: Sitting, Cuff Size: Normal)  Pulse 61   Resp 12   Ht 5' 6.5" (1.689 m)   Wt 140 lb (63.5 kg)   BMI 22.26 kg/m    Physical Exam  Constitutional: He is oriented to person, place, and time. He appears well-developed and well-nourished.  HENT:  Head: Normocephalic and atraumatic.  Eyes: Pupils  are equal, round, and reactive to light. Conjunctivae and EOM are normal.  Neck: Normal range of motion. Neck supple.  Cardiovascular: Normal rate, regular rhythm and normal heart sounds.  Pulmonary/Chest: Effort normal and breath sounds normal.  Abdominal: Soft. Bowel sounds are normal.  Lymphadenopathy:    He has no cervical adenopathy.  Neurological: He is alert and oriented to person, place, and time.  Skin: Skin is warm and dry. Capillary refill takes less than 2 seconds.  Patch of psoriasis on the extensor surface of his left elbow.  He has scattered patches of psoriasis present.  Psychiatric: He has a normal mood and affect. His behavior is normal.  Nursing note and vitals reviewed.    Musculoskeletal Exam: C-spine, thoracic spine, lumbar spine good range of motion.  No midline spinal tenderness.  No SI joint tenderness.  Shoulder joints, elbow joints, wrist joints, MCPs, PIPs, DIPs good range of motion with no synovitis.  Hip joints, knee joints, ankle joints, MCPs, PIPs, DIPs good range of motion no synovitis.  No warmth or effusion of bilateral knee joints.  No tenderness of the Achilles tendon or plantar fascia.  No tenderness of trochanter bursa bilaterally.  CDAI Exam: No CDAI exam completed.    Investigation: No additional findings. CBC    Component Value Date/Time   WBC 5.2 01/03/2018 1401   RBC 4.67 01/03/2018 1401   HGB 15.1 01/03/2018 1401   HGB 15.9 09/14/2016 1206   HCT 44.5 01/03/2018 1401   HCT 44.7 09/14/2016 1206   PLT 228 01/03/2018 1401   PLT 265 09/14/2016 1206   MCV 95.3 01/03/2018 1401   MCV 89 09/14/2016 1206   MCH 32.3 01/03/2018 1401   MCHC 33.9 01/03/2018 1401   RDW 13.0 01/03/2018 1401   RDW 14.5 09/14/2016 1206   LYMPHSABS 1,518 01/03/2018 1401   LYMPHSABS 1.7 09/14/2016 1206   MONOABS 325 03/09/2017 1617   EOSABS 62 01/03/2018 1401   EOSABS 0.1 09/14/2016 1206   BASOSABS 52 01/03/2018 1401   BASOSABS 0.0 09/14/2016 1206   CMP Latest  Ref Rng & Units 01/03/2018 09/21/2017 06/15/2017  Glucose 65 - 99 mg/dL 90 83 92  BUN 7 - 25 mg/dL 11 8 9   Creatinine 0.60 - 1.35 mg/dL 0.96 1.00 0.96  Sodium 135 - 146 mmol/L 139 139 138  Potassium 3.5 - 5.3 mmol/L 4.5 4.2 4.7  Chloride 98 - 110 mmol/L 103 106 103  CO2 20 - 32 mmol/L 31 29 28   Calcium 8.6 - 10.3 mg/dL 9.4 9.3 9.9  Total Protein 6.1 - 8.1 g/dL 6.9 6.4 7.1  Total Bilirubin 0.2 - 1.2 mg/dL 0.8 0.5 0.9  Alkaline Phos 40 - 115 U/L - - -  AST 10 - 40 U/L 24 23 28   ALT 9 - 46 U/L 32 26 50(H)     Imaging: No results found.  Speciality Comments: No specialty comments available.    Procedures:  No procedures performed Allergies: Patient has no known allergies.   Assessment / Plan:     Visit Diagnoses: Psoriatic arthritis ([place]): He has no synovitis or dactylitis on exam.  He has not had any recent flares.  He has  no increased joint pain or joint swelling.  He wears insoles in bilateral shoes which which have improved his symptoms of plantar fasciitis.  He has no Achilles tendinitis.  He road in the car about 1 week ago to [place] and developed right SI joint pain which has resolved.  He has no tenderness on exam today.  No symptoms of sciatica at this time.  He continues to have scattered patches of psoriasis.  He is clinically doing well on MTX 5 tablets by mouth once weekly and folic acid 1 mg daily.  He was encouraged to take Folic acid 1 mg daily since he has only been taking it once a week.  He does not need any refills at this time.  He was advised to notify us if he develops increased joint pain or joint swelling.   Psoriasis: He continues to have scattered patches of psoriasis.  He has a large patch of psoriasis on the extensor surface of his left elbow.  He uses Vaseline topically.  He does not want a prescription strength cream at this time.  He will continue taking methotrexate 5 tablets by mouth once weekly.  Plantar fasciitis: He wears orthotics in bilateral shoes  which have improved his symptoms.  High risk medication use - MTX 5 tablets and folic acid 1 mg daily.  He does not need any refills at this time.  CBC and CMP were drawn on 01/03/2018 that were within normal limits.  He will return in September and every 3 months for lab work to monitor for drug toxicity.  FHx: hemochromatosis    Orders: No orders of the defined types were placed in this encounter.  No orders of the defined types were placed in this encounter.    Follow-Up Instructions: Return in about 5 months (around 07/02/2018) for Psoriatic arthritis.   Ofilia Neas, PA-C I examined and evaluated the patient with Hazel Sams PA. The plan of care was discussed as noted above.  Bo Merino, MD Note - This record has been created using Editor, commissioning.  Chart creation errors have been sought, but may not always  have been located. Such creation errors do not reflect on  the standard of medical care.

## 2018-01-30 ENCOUNTER — Ambulatory Visit: Payer: BC Managed Care – PPO | Admitting: Physician Assistant

## 2018-01-30 ENCOUNTER — Encounter: Payer: Self-pay | Admitting: Physician Assistant

## 2018-01-30 VITALS — BP 115/77 | HR 61 | Resp 12 | Ht 66.5 in | Wt 140.0 lb

## 2018-01-30 DIAGNOSIS — Z79899 Other long term (current) drug therapy: Secondary | ICD-10-CM

## 2018-01-30 DIAGNOSIS — Z8349 Family history of other endocrine, nutritional and metabolic diseases: Secondary | ICD-10-CM

## 2018-01-30 DIAGNOSIS — L409 Psoriasis, unspecified: Secondary | ICD-10-CM

## 2018-01-30 DIAGNOSIS — L4050 Arthropathic psoriasis, unspecified: Secondary | ICD-10-CM

## 2018-01-30 DIAGNOSIS — M722 Plantar fascial fibromatosis: Secondary | ICD-10-CM

## 2018-01-30 NOTE — Patient Instructions (Signed)
Standing Labs We placed an order today for your standing lab work.    Please come back and get your standing labs in September and every 3 months   We have open lab Monday through Friday from 8:30-11:30 AM and 1:30-4:00 PM  at the office of Dr. Bo Merino.   You may experience shorter wait times on Monday and Friday afternoons. The office is located at [address], [place], [place], [place] [address] No appointment is necessary.   Labs are drawn by [facility].  You may receive a bill from [facility] for your lab work. If you have any questions regarding directions or hours of operation,  please call [phone].

## 2018-04-07 IMAGING — DX DG CHEST 2V
2 series · 2 of 2 positions shown · non-contrast
Comparison: None.

CLINICAL DATA: High risk medication use. Pre immunosuppressant
therapy.

EXAM:
CHEST  2 VIEW

[chest pa]
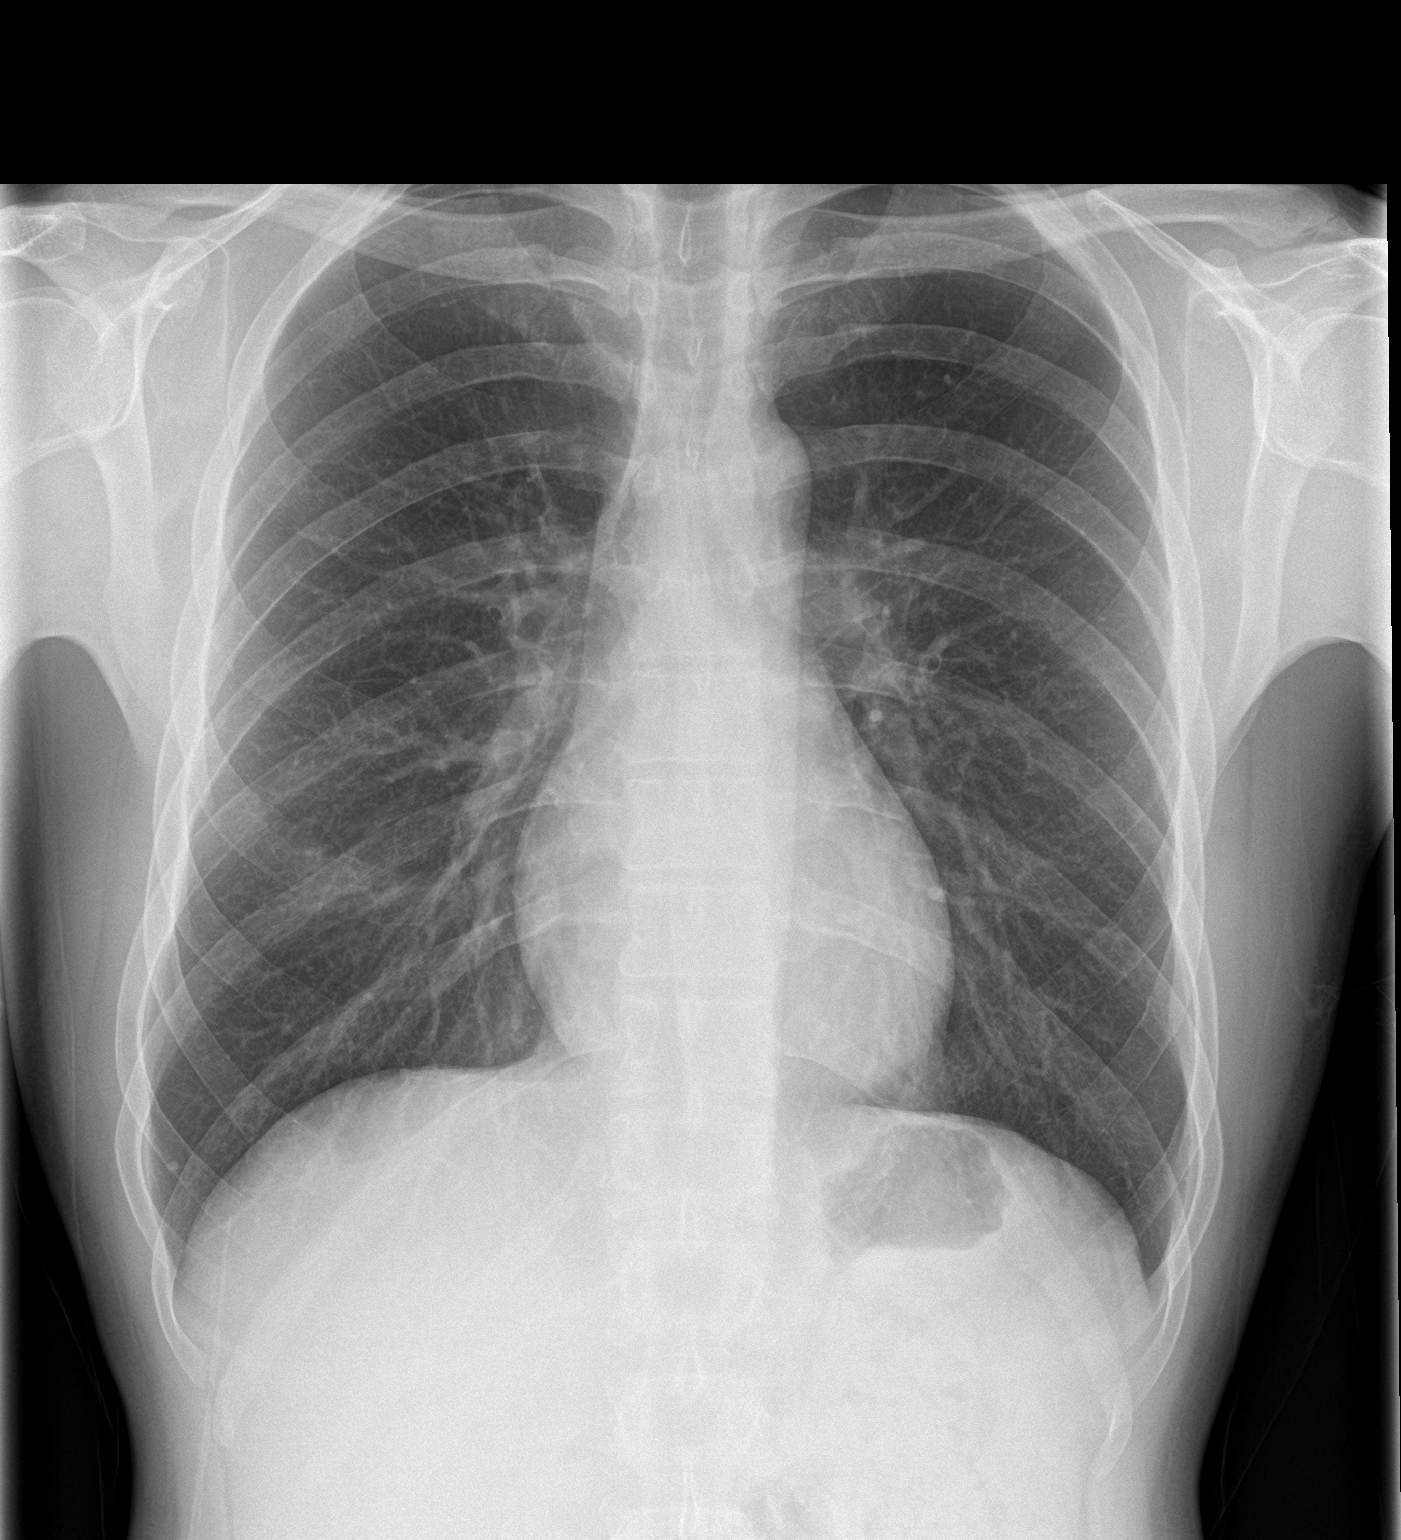

[chest lat]
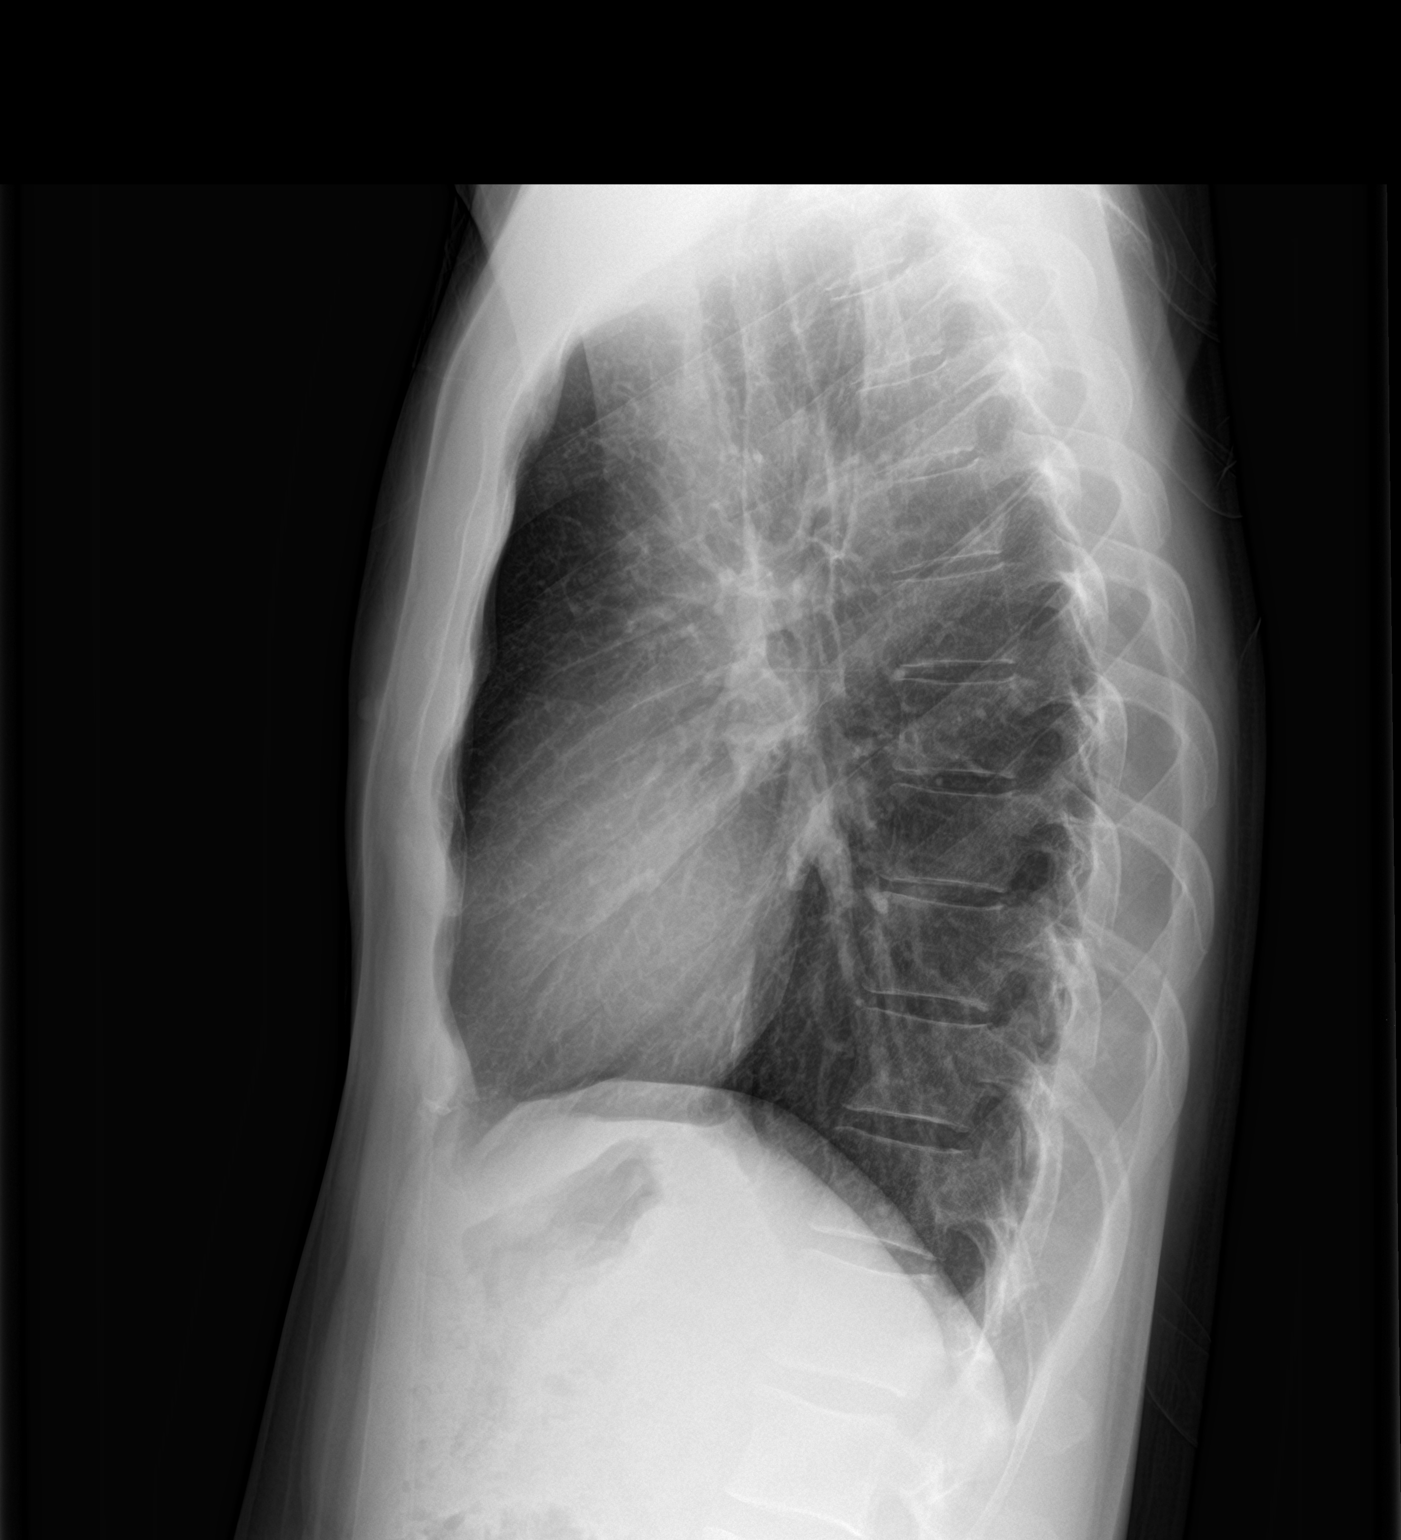

[2 of 2 positions shown; findings below may reference images not displayed]

FINDINGS: The heart size and mediastinal contours are within normal limits.
Both lungs are clear except for a tiny calcified granuloma at the
right lung base laterally. The visualized skeletal structures are
unremarkable.
IMPRESSION: No significant abnormalities.-

## 2018-04-15 ENCOUNTER — Other Ambulatory Visit: Payer: Self-pay | Admitting: Rheumatology

## 2018-04-16 ENCOUNTER — Other Ambulatory Visit: Payer: Self-pay

## 2018-04-16 DIAGNOSIS — Z79899 Other long term (current) drug therapy: Secondary | ICD-10-CM

## 2018-04-16 NOTE — Telephone Encounter (Addendum)
Last Visit: 01/30/18 Next visit: 06/28/18 Labs: 01/03/18 WNL  Patient advised he is due for labs. Patient will come for labs the this.   Okay to refill 30 day supply per Dr. Estanislado Pandy

## 2018-04-17 LAB — COMPLETE METABOLIC PANEL WITH GFR
AG RATIO: 2 (calc) (ref 1.0–2.5)
ALT: 28 U/L (ref 9–46)
AST: 23 U/L (ref 10–40)
Albumin: 4.6 g/dL (ref 3.6–5.1)
Alkaline phosphatase (APISO): 74 U/L (ref 40–115)
BILIRUBIN TOTAL: 0.8 mg/dL (ref 0.2–1.2)
BUN: 11 mg/dL (ref 7–25)
CO2: 30 mmol/L (ref 20–32)
Calcium: 9.7 mg/dL (ref 8.6–10.3)
Chloride: 103 mmol/L (ref 98–110)
Creat: 0.84 mg/dL (ref 0.60–1.35)
GFR, EST AFRICAN AMERICAN: 123 mL/min/{1.73_m2} (ref >=60)
GFR, Est Non African American: 106 mL/min/{1.73_m2} (ref >=60)
GLOBULIN: 2.3 g/dL (ref 1.9–3.7)
Glucose, Bld: 87 mg/dL (ref 65–99)
Potassium: 4.7 mmol/L (ref 3.5–5.3)
SODIUM: 138 mmol/L (ref 135–146)
Total Protein: 6.9 g/dL (ref 6.1–8.1)

## 2018-04-17 LAB — CBC WITH DIFFERENTIAL/PLATELET
BASOS ABS: 40 {cells}/uL (ref 0–200)
BASOS PCT: 0.7 %
EOS ABS: 68 {cells}/uL (ref 15–500)
Eosinophils Relative: 1.2 %
HCT: 45.7 % (ref 38.5–50.0)
Hemoglobin: 15.6 g/dL (ref 13.2–17.1)
Lymphs Abs: 1545 cells/uL (ref 850–3900)
MCH: 32.3 pg (ref 27.0–33.0)
MCHC: 34.1 g/dL (ref 32.0–36.0)
MCV: 94.6 fL (ref 80.0–100.0)
MPV: 11.2 fL (ref 7.5–12.5)
Monocytes Relative: 7.6 %
NEUTROS PCT: 63.4 %
Neutro Abs: 3614 cells/uL (ref 1500–7800)
PLATELETS: 230 10*3/uL (ref 140–400)
RBC: 4.83 10*6/uL (ref 4.20–5.80)
RDW: 12.4 % (ref 11.0–15.0)
TOTAL LYMPHOCYTE: 27.1 %
WBC: 5.7 10*3/uL (ref 3.8–10.8)
WBCMIX: 433 {cells}/uL (ref 200–950)

## 2018-05-12 ENCOUNTER — Other Ambulatory Visit: Payer: Self-pay | Admitting: Rheumatology

## 2018-05-14 NOTE — Telephone Encounter (Signed)
Last Visit: 01/30/18 Next visit: 06/28/18 Labs:04/16/18 WNL  Okay to refill per Dr. Estanislado Pandy

## 2018-06-14 NOTE — Progress Notes (Signed)
Office Visit Note  Patient: Rick Santiago             Date of Birth: 09-11-1975           MRN: [id number]             PCP: [name], MD Referring: [name], MD Visit Date: 06/28/2018 Occupation: @GUAROCC @  Subjective:  Psoriasis.   History of Present Illness: Rick Santiago is a 42 y.o. male  with history of psoriatic arthritis and plantar fasciitis.  Patient reports that he has not had any joint swelling lately.  He states he had some SI joint discomfort one time which resolved by itself.  He had no recurrence of plantar fasciitis.  He continues to have some psoriasis patches which are not changed.  He has been taking methotrexate 5 tablets/week.  He states he forgets to take folic acid.  Activities of Daily Living:  Patient reports morning stiffness for 0 minutes.   Patient Denies nocturnal pain.  Difficulty dressing/grooming: Denies Difficulty climbing stairs: Denies Difficulty getting out of chair: Denies Difficulty using hands for taps, buttons, cutlery, and/or writing: Denies  Review of Systems  Constitutional: Negative for fatigue.  HENT: Negative for mouth sores, trouble swallowing, trouble swallowing and mouth dryness.   Eyes: Negative for photophobia, pain, redness, itching and dryness.  Respiratory: Negative for shortness of breath, wheezing and difficulty breathing.   Cardiovascular: Negative for chest pain, palpitations, hypertension and swelling in legs/feet.  Gastrointestinal: Negative for abdominal pain, blood in stool, constipation, diarrhea, heartburn and vomiting.  Endocrine: Negative for increased urination.  Genitourinary: Negative for painful urination, pelvic pain and urgency.  Musculoskeletal: Negative for arthralgias, joint pain, joint swelling and morning stiffness.  Skin: Positive for rash. Negative for hair loss.  Allergic/Immunologic: Negative for susceptible to infections.  Neurological: Negative for dizziness, light-headedness,  headaches, memory loss and weakness.  Hematological: Negative for bruising/bleeding tendency.  Psychiatric/Behavioral: Negative for confusion. The patient is not nervous/anxious.     PMFS History:  Patient Active Problem List   Diagnosis Date Noted  . Plantar fasciitis 09/29/2016  . Psoriasis 09/29/2016  . High risk medication use 09/29/2016  . Psoriatic arthritis ([place]) 09/14/2016  . FHx: hemochromatosis 09/14/2016  . Encounter for methotrexate monitoring 09/14/2016  . Family history of diabetes mellitus in mother 09/14/2016    Past Medical History:  Diagnosis Date  . Arthritis   . Psoriasis     Family History  Problem Relation Age of Onset  . Diabetes Mother   . Heart disease Father   . Irritable bowel syndrome Daughter    History reviewed. No pertinent surgical history. Social History   Social History Narrative  . Not on file   Immunization History  Administered Date(s) Administered  . Influenza,inj,Quad PF,6+ Mos 03/27/2017  . Influenza-Unspecified 04/10/2016  . Tdap 06/21/2018    Objective: Vital Signs: BP 115/82 (BP Location: Left Arm, Patient Position: Sitting, Cuff Size: Normal)   Pulse 64   Resp 13   Ht 5' 7"  (1.702 m)   Wt 135 lb (61.2 kg)   BMI 21.14 kg/m    Physical Exam Vitals signs and nursing note reviewed.  Constitutional:      Appearance: He is well-developed.  HENT:     Head: Normocephalic and atraumatic.  Eyes:     Conjunctiva/sclera: Conjunctivae normal.     Pupils: Pupils are equal, round, and reactive to light.  Neck:     Musculoskeletal: Normal range of motion and neck supple.  Cardiovascular:     Rate and Rhythm: Normal rate and regular rhythm.     Heart sounds: Normal heart sounds.  Pulmonary:     Effort: Pulmonary effort is normal.     Breath sounds: Normal breath sounds.  Abdominal:     General: Bowel sounds are normal.     Palpations: Abdomen is soft.  Skin:    General: Skin is warm and dry.     Capillary Refill:  Capillary refill takes less than 2 seconds.     Comments: Psoriasis patches were noted behind ears and over left forearm.  Neurological:     Mental Status: He is alert and oriented to person, place, and time.  Psychiatric:        Behavior: Behavior normal.      Musculoskeletal Exam: C-spine, thoracic and lumbar spine good range of motion.  He had no SI joint tenderness.  Shoulder joints, elbow joints, wrist joints, MCPs PIPs DIPs with good range of motion with no synovitis.  Hip joints, knee joints, ankles MTPs PIPs with good range of motion with no synovitis.  He had no tenderness or plantar fascia or Achilles tendon today.  CDAI Exam: CDAI Score: 0.2  Patient Global Assessment: 1 (mm); Provider Global Assessment: 1 (mm) Swollen: 0 ; Tender: 0  Joint Exam   Not documented   There is currently no information documented on the homunculus. Go to the Rheumatology activity and complete the homunculus joint exam.  Investigation: No additional findings.  Imaging: No results found.  Recent Labs: Lab Results  Component Value Date   WBC 5.6 06/21/2018   HGB 16.5 06/21/2018   PLT 267 06/21/2018   NA 142 06/21/2018   K 4.3 06/21/2018   CL 104 06/21/2018   CO2 24 06/21/2018   GLUCOSE 95 06/21/2018   BUN 11 06/21/2018   CREATININE 0.94 06/21/2018   BILITOT 0.5 06/21/2018   ALKPHOS 79 06/21/2018   AST 21 06/21/2018   ALT 36 06/21/2018   PROT 7.0 06/21/2018   ALBUMIN 4.8 06/21/2018   CALCIUM 9.4 06/21/2018   GFRAA 114 06/21/2018    Speciality Comments: No specialty comments available.  Procedures:  No procedures performed Allergies: Patient has no known allergies.   Assessment / Plan:     Visit Diagnoses: Psoriatic arthritis ([facility])-patient had no synovitis on examination today.  He states he is been doing well on methotrexate 5 tablets/week.  He denies any flares of Achilles tendinitis or plantar fasciitis.  Patient has not been taking folic acid.  I did emphasize the need of  taking folic acid on a regular basis.  Psoriasis-he has some psoriasis patches.  Increasing methotrexate dose but he declined.  High risk medication use -Methotrexate 5 tablets weekly and folic acid 1 mg daily.  Most recent CBC/CMP within normal limits on 04/16/2018.  Next CBC/CMP due in January and then every 3 months.  Standing orders are in place. Recommend flu, Pneumovax 23, and Prevnar 13 vaccine as indicated.   Plantar fasciitis-he had plantar fasciitis in the past.  But he had no fasciitis on examination today.  FHx: hemochromatosis  Family history of psoriasis   Orders: No orders of the defined types were placed in this encounter.  No orders of the defined types were placed in this encounter.    Follow-Up Instructions: Return in about 5 months (around 11/27/2018) for Psoriatic arthritis, [place].   Bo Merino, MD  Note - This record has been created using Editor, commissioning.  Chart creation errors  have been sought, but may not always  have been located. Such creation errors do not reflect on  the standard of medical care.

## 2018-06-21 ENCOUNTER — Other Ambulatory Visit: Payer: Self-pay

## 2018-06-21 ENCOUNTER — Encounter: Payer: Self-pay | Admitting: Family Medicine

## 2018-06-21 ENCOUNTER — Ambulatory Visit (INDEPENDENT_AMBULATORY_CARE_PROVIDER_SITE_OTHER): Payer: BC Managed Care – PPO | Admitting: Family Medicine

## 2018-06-21 VITALS — BP 114/78 | HR 76 | Temp 98.3°F | Resp 17 | Ht 66.5 in | Wt 136.6 lb

## 2018-06-21 DIAGNOSIS — Z5181 Encounter for therapeutic drug level monitoring: Secondary | ICD-10-CM | POA: Diagnosis not present

## 2018-06-21 DIAGNOSIS — H6121 Impacted cerumen, right ear: Secondary | ICD-10-CM

## 2018-06-21 DIAGNOSIS — Z113 Encounter for screening for infections with a predominantly sexual mode of transmission: Secondary | ICD-10-CM | POA: Diagnosis not present

## 2018-06-21 DIAGNOSIS — Z23 Encounter for immunization: Secondary | ICD-10-CM

## 2018-06-21 DIAGNOSIS — Z8349 Family history of other endocrine, nutritional and metabolic diseases: Secondary | ICD-10-CM

## 2018-06-21 DIAGNOSIS — Z79899 Other long term (current) drug therapy: Secondary | ICD-10-CM

## 2018-06-21 DIAGNOSIS — Z0001 Encounter for general adult medical examination with abnormal findings: Secondary | ICD-10-CM

## 2018-06-21 DIAGNOSIS — L4050 Arthropathic psoriasis, unspecified: Secondary | ICD-10-CM

## 2018-06-21 DIAGNOSIS — Z299 Encounter for prophylactic measures, unspecified: Secondary | ICD-10-CM

## 2018-06-21 DIAGNOSIS — Z0000 Encounter for general adult medical examination without abnormal findings: Secondary | ICD-10-CM

## 2018-06-21 NOTE — Progress Notes (Addendum)
Chief Complaint  Patient presents with  . Annual Exam    cpe    Subjective:  Rick Santiago is a 42 y.o. male here for a health maintenance visit.  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     Patient Active Problem List   Diagnosis Date Noted  . Plantar fasciitis 09/29/2016  . Psoriasis 09/29/2016  . High risk medication use 09/29/2016  . Psoriatic arthritis ([place]) 09/14/2016  . FHx: hemochromatosis 09/14/2016  . Encounter for methotrexate monitoring 09/14/2016  . Family history of diabetes mellitus in mother 09/14/2016    Past Medical History:  Diagnosis Date  . Arthritis   . Psoriasis     History reviewed. No pertinent surgical history.   Outpatient Medications Prior to Visit  Medication Sig Dispense Refill  . methotrexate (RHEUMATREX) 2.5 MG tablet TAKE 5 TABLETS BY MOUTH ONCE A WEEK. CAUTION:CHEMOTHERAPY. PROTECT FROM LIGHT. 60 tablet 0  . folic acid (FOLVITE) 1 MG tablet Take 1 tablet (1 mg total) by mouth daily. (Patient not taking: Reported on 06/21/2018) 90 tablet 3   No facility-administered medications prior to visit.     No Known Allergies   Family History  Problem Relation Age of Onset  . Diabetes Mother   . Heart disease Father   . Irritable bowel syndrome Daughter      Health Habits: Dental Exam: up to date Eye Exam: not up to date   Social History   Socioeconomic History  . Marital status: Married    Spouse name: Not on file  . Number of children: Not on file  . Years of education: Not on file  . Highest education level: Not on file  Occupational History  . Not on file  Social Needs  . Financial resource strain: Not on file  . Food insecurity:    Worry: Not on file    Inability: Not on file  .  Transportation needs:    Medical: Not on file    Non-medical: Not on file  Tobacco Use  . Smoking status: Never Smoker  . Smokeless tobacco: Never Used  Substance and Sexual Activity  . Alcohol use: No  . Drug use: No  . Sexual activity: Not on file  Lifestyle  . Physical activity:    Days per week: Not on file    Minutes per session: Not on file  . Stress: Not on file  Relationships  . Social connections:    Talks on phone: Not on file    Gets together: Not on file    Attends religious service: Not on file    Active member of club or organization: Not on file    Attends meetings of clubs or organizations: Not on file    Relationship status: Not on file  . Intimate partner violence:    Fear of current or ex partner: Not on file    Emotionally abused: Not on file    Physically abused: Not on file    Forced sexual activity: Not on file  Other Topics Concern  . Not on file  Social History Narrative  . Not on file   Social History   Substance and Sexual Activity  Alcohol Use No   Social History   Tobacco Use  Smoking  Status Never Smoker  Smokeless Tobacco Never Used   Social History   Substance and Sexual Activity  Drug Use No     Health Maintenance: See under health Maintenance activity for review of completion dates as well. Immunization History  Administered Date(s) Administered  . Influenza,inj,Quad PF,6+ Mos 03/27/2017  . Influenza-Unspecified 04/10/2016  . Tdap 06/21/2018      Depression Screen-PHQ2/9 Depression screen [facility] 2/9 06/21/2018 09/14/2016  Decreased Interest 0 0  Down, Depressed, Hopeless 0 0  PHQ - 2 Score 0 0       Depression Severity and Treatment Recommendations:  0-4= None  5-9= Mild / Treatment: Support, educate to call if worse; return in one month  10-14= Moderate / Treatment: Support, watchful waiting; Antidepressant or Psycotherapy  15-19= Moderately severe / Treatment: Antidepressant OR Psychotherapy  >= 20 = Major  depression, severe / Antidepressant AND Psychotherapy    Review of Systems   ROS  See HPI for ROS as well.   Review of Systems  Constitutional: Negative for activity change, appetite change, chills and fever.  HENT: Negative for congestion, nosebleeds, trouble swallowing and voice change.   Respiratory: Negative for cough, shortness of breath and wheezing.   Gastrointestinal: Negative for diarrhea, nausea and vomiting.  Genitourinary: Negative for difficulty urinating, dysuria, flank pain and hematuria.  Musculoskeletal: Negative for back pain, joint swelling and neck pain.  Neurological: Negative for dizziness, speech difficulty, light-headedness and numbness.  See HPI. All other review of systems negative.   Objective:   Vitals:   06/21/18 0816  BP: 114/78  Pulse: 76  Resp: 17  Temp: 98.3 F (36.8 C)  TempSrc: Oral  SpO2: 99%  Weight: 136 lb 9.6 oz (62 kg)  Height: 5' 6.5" (1.689 m)    Body mass index is 21.72 kg/m.  Physical Exam Constitutional:      Appearance: Normal appearance.  HENT:     Head: Normocephalic and atraumatic.     Right Ear: There is impacted cerumen.     Left Ear: There is no impacted cerumen.     Nose: Nose normal.     Mouth/Throat:     Mouth: Mucous membranes are moist.  Eyes:     Extraocular Movements: Extraocular movements intact.     Conjunctiva/sclera: Conjunctivae normal.  Cardiovascular:     Rate and Rhythm: Normal rate and regular rhythm.     Pulses: Normal pulses.     Heart sounds: Normal heart sounds. No murmur.  Pulmonary:     Effort: Pulmonary effort is normal. No respiratory distress.     Breath sounds: Normal breath sounds. No wheezing.  Abdominal:     General: Abdomen is flat. There is no distension.     Palpations: Abdomen is soft. There is no mass.     Tenderness: There is no abdominal tenderness. There is no guarding or rebound.     Hernia: No hernia is present.  Musculoskeletal: Normal range of motion.         General: No swelling or tenderness.  Skin:    General: Skin is warm.     Capillary Refill: Capillary refill takes less than 2 seconds.  Neurological:     Mental Status: He is alert.  Psychiatric:        Mood and Affect: Mood normal.        Behavior: Behavior normal.        Thought Content: Thought content normal.        Judgment: Judgment normal.  Assessment/Plan:   Patient was seen for a health maintenance exam.  Counseled the patient on health maintenance issues. Reviewed her health mainteance schedule and ordered appropriate tests (see orders.) Counseled on regular exercise and weight management. Recommend regular eye exams and dental cleaning.   The following issues were addressed today for health maintenance:   [place] was seen today for annual exam.  Diagnoses and all orders for this visit:  Health maintenance examination- discussed screening based on age   Need for Tdap vaccination -     Tdap vaccine greater than or equal to 7yo IM  Screen for STD (sexually transmitted disease)- discussed std screening  -     Hepatitis B surface antigen -     HIV Antibody (routine testing w rflx) -     RPR -     GC/Chlamydia Probe Amp  Encounter for medication monitoring -     CMP14+EGFR -     Lipid panel -     CBC with Differential/Platelet  Psoriatic arthritis ([place]) -     CBC with Differential/Platelet  Encounter for methotrexate monitoring -     CMP14+EGFR -     CBC with Differential/Platelet  FHx: hemochromatosis -     Iron, TIBC and Ferritin Panel  Impacted cerumen of right ear- resolved with ear lavage -     Ear wax removal  Need for prophylactic measure    Return in about 1 year (around 06/22/2019).    Body mass index is 21.72 kg/m.:  Discussed the patient's BMI with patient. The BMI body mass index is 21.72 kg/m.     Future Appointments  Date Time Provider [place]  11/27/2018  3:20 PM Ofilia Neas, PA-C [facility] None    Patient  Instructions       If you have lab work done today you will be contacted with your lab results within the next 2 weeks.  If you have not heard from [place] then please contact us. The fastest way to get your results is to register for My Chart.   IF you received an x-ray today, you will receive an invoice from [facility]. Please contact [facility] at [phone] with questions or concerns regarding your invoice.   IF you received labwork today, you will receive an invoice from [facility]. Please contact [facility] at [phone] with questions or concerns regarding your invoice.   Our billing staff will not be able to assist you with questions regarding bills from these companies.  You will be contacted with the lab results as soon as they are available. The fastest way to get your results is to activate your My Chart account. Instructions are located on the last page of this paperwork. If you have not heard from [place] regarding the results in 2 weeks, please contact this office.

## 2018-06-21 NOTE — Patient Instructions (Signed)
° ° ° °  If you have lab work done today you will be contacted with your lab results within the next 2 weeks.  If you have not heard from us then please contact us. The fastest way to get your results is to register for My Chart. ° ° °IF you received an x-ray today, you will receive an invoice from [facility]. Please contact [facility] at [phone] with questions or concerns regarding your invoice.  ° °IF you received labwork today, you will receive an invoice from [facility]. Please contact [facility] at [phone] with questions or concerns regarding your invoice.  ° °Our billing staff will not be able to assist you with questions regarding bills from these companies. ° °You will be contacted with the lab results as soon as they are available. The fastest way to get your results is to activate your My Chart account. Instructions are located on the last page of this paperwork. If you have not heard from us regarding the results in 2 weeks, please contact this office. °  ° ° ° °

## 2018-06-22 LAB — CBC WITH DIFFERENTIAL/PLATELET
BASOS: 1 %
Basophils Absolute: 0.1 10*3/uL (ref 0.0–0.2)
EOS (ABSOLUTE): 0.1 10*3/uL (ref 0.0–0.4)
EOS: 2 %
HEMATOCRIT: 47.8 % (ref 37.5–51.0)
Hemoglobin: 16.5 g/dL (ref 13.0–17.7)
IMMATURE GRANULOCYTES: 0 %
Immature Grans (Abs): 0 10*3/uL (ref 0.0–0.1)
LYMPHS ABS: 1.2 10*3/uL (ref 0.7–3.1)
Lymphs: 22 %
MCH: 32.2 pg (ref 26.6–33.0)
MCHC: 34.5 g/dL (ref 31.5–35.7)
MCV: 93 fL (ref 79–97)
MONOS ABS: 0.3 10*3/uL (ref 0.1–0.9)
Monocytes: 5 %
Neutrophils Absolute: 3.9 10*3/uL (ref 1.4–7.0)
Neutrophils: 70 %
Platelets: 267 10*3/uL (ref 150–450)
RBC: 5.13 x10E6/uL (ref 4.14–5.80)
RDW: 12.8 % (ref 12.3–15.4)
WBC: 5.6 10*3/uL (ref 3.4–10.8)

## 2018-06-22 LAB — CMP14+EGFR
ALK PHOS: 79 IU/L (ref 39–117)
ALT: 36 IU/L (ref 0–44)
AST: 21 IU/L (ref 0–40)
Albumin/Globulin Ratio: 2.2 (ref 1.2–2.2)
Albumin: 4.8 g/dL (ref 3.5–5.5)
BILIRUBIN TOTAL: 0.5 mg/dL (ref 0.0–1.2)
BUN/Creatinine Ratio: 12 (ref 9–20)
BUN: 11 mg/dL (ref 6–24)
CHLORIDE: 104 mmol/L (ref 96–106)
CO2: 24 mmol/L (ref 20–29)
Calcium: 9.4 mg/dL (ref 8.7–10.2)
Creatinine, Ser: 0.94 mg/dL (ref 0.76–1.27)
GFR calc Af Amer: 114 mL/min/{1.73_m2} (ref >59)
GFR calc non Af Amer: 98 mL/min/{1.73_m2} (ref >59)
GLUCOSE: 95 mg/dL (ref 65–99)
Globulin, Total: 2.2 g/dL (ref 1.5–4.5)
Potassium: 4.3 mmol/L (ref 3.5–5.2)
Sodium: 142 mmol/L (ref 134–144)
TOTAL PROTEIN: 7 g/dL (ref 6.0–8.5)

## 2018-06-22 LAB — LIPID PANEL
CHOLESTEROL TOTAL: 194 mg/dL (ref 100–199)
Chol/HDL Ratio: 3.5 ratio (ref 0.0–5.0)
HDL: 55 mg/dL (ref >39)
LDL Calculated: 126 mg/dL — ABNORMAL HIGH (ref 0–99)
Triglycerides: 67 mg/dL (ref 0–149)
VLDL CHOLESTEROL CAL: 13 mg/dL (ref 5–40)

## 2018-06-22 LAB — IRON,TIBC AND FERRITIN PANEL
FERRITIN: 166 ng/mL (ref 30–400)
IRON: 90 ug/dL (ref 38–169)
Iron Saturation: 29 % (ref 15–55)
Total Iron Binding Capacity: 310 ug/dL (ref 250–450)
UIBC: 220 ug/dL (ref 111–343)

## 2018-06-22 LAB — RPR: RPR: NONREACTIVE

## 2018-06-22 LAB — HIV ANTIBODY (ROUTINE TESTING W REFLEX): HIV Screen 4th Generation wRfx: NONREACTIVE

## 2018-06-22 LAB — HEPATITIS B SURFACE ANTIGEN: Hepatitis B Surface Ag: NEGATIVE

## 2018-06-23 LAB — GC/CHLAMYDIA PROBE AMP
Chlamydia trachomatis, NAA: NEGATIVE
Neisseria gonorrhoeae by PCR: NEGATIVE

## 2018-06-26 ENCOUNTER — Encounter: Payer: Self-pay | Admitting: Family Medicine

## 2018-06-28 ENCOUNTER — Telehealth: Payer: Self-pay | Admitting: Family Medicine

## 2018-06-28 ENCOUNTER — Ambulatory Visit: Payer: BC Managed Care – PPO | Admitting: Rheumatology

## 2018-06-28 ENCOUNTER — Encounter: Payer: Self-pay | Admitting: Physician Assistant

## 2018-06-28 VITALS — BP 115/82 | HR 64 | Resp 13 | Ht 67.0 in | Wt 135.0 lb

## 2018-06-28 DIAGNOSIS — Z840 Family history of diseases of the skin and subcutaneous tissue: Secondary | ICD-10-CM

## 2018-06-28 DIAGNOSIS — Z79899 Other long term (current) drug therapy: Secondary | ICD-10-CM | POA: Diagnosis not present

## 2018-06-28 DIAGNOSIS — L4050 Arthropathic psoriasis, unspecified: Secondary | ICD-10-CM | POA: Diagnosis not present

## 2018-06-28 DIAGNOSIS — Z8349 Family history of other endocrine, nutritional and metabolic diseases: Secondary | ICD-10-CM | POA: Diagnosis not present

## 2018-06-28 DIAGNOSIS — L409 Psoriasis, unspecified: Secondary | ICD-10-CM | POA: Diagnosis not present

## 2018-06-28 NOTE — Patient Instructions (Signed)
Standing Labs We placed an order today for your standing lab work.    Please come back and get your standing labs in March and every 3 months  We have open lab Monday through Friday from 8:30-11:30 AM and 1:30-4:00 PM  at the office of Dr. Bo Merino.   You may experience shorter wait times on Monday and Friday afternoons. The office is located at [address], [place], [place], [place] [address] No appointment is necessary.   Labs are drawn by [facility].  You may receive a bill from [facility] for your lab work.  If you wish to have your labs drawn at another location, please call the office 24 hours in advance to send orders.  If you have any questions regarding directions or hours of operation,  please call [phone].   Just as a reminder please drink plenty of water prior to coming for your lab work. Thanks!

## 2018-06-28 NOTE — Telephone Encounter (Signed)
Copied from [place] [phone]. Topic: Quick Communication - See Telephone Encounter >> Jun 28, 2018  1:46 PM Blase Mess A wrote: CRM for notification. See Telephone encounter for: 06/28/18.  Patient is calling to check to see if lab results are in. Please advise.

## 2018-06-29 NOTE — Telephone Encounter (Signed)
Please advise 

## 2018-06-30 NOTE — Telephone Encounter (Signed)
Results released to mychart. All results were normal.

## 2018-07-02 ENCOUNTER — Encounter: Payer: BC Managed Care – PPO | Admitting: Family Medicine

## 2018-07-02 NOTE — Telephone Encounter (Signed)
Pt advised.

## 2018-07-29 ENCOUNTER — Other Ambulatory Visit: Payer: Self-pay | Admitting: Rheumatology

## 2018-07-30 NOTE — Telephone Encounter (Signed)
Last Visit: 06/28/18 Next Visit: 11/27/18 Labs: 06/21/18 cbc/cmp WNL  Okay to refill per Dr. Estanislado Pandy

## 2018-10-03 ENCOUNTER — Telehealth: Payer: Self-pay | Admitting: Rheumatology

## 2018-10-03 NOTE — Telephone Encounter (Signed)
Patient left a voicemail requesting a return call to let him know if he needs to have his labwork in order to refill his prescription.

## 2018-10-03 NOTE — Telephone Encounter (Signed)
Attempted to contact patient and left message on machine to advise patient he is due for labs this month. Advised patient of morning lab hours of 8:30am until 11:30am.

## 2018-10-04 ENCOUNTER — Other Ambulatory Visit: Payer: Self-pay

## 2018-10-04 DIAGNOSIS — Z79899 Other long term (current) drug therapy: Secondary | ICD-10-CM

## 2018-10-04 LAB — COMPLETE METABOLIC PANEL WITH GFR
AG Ratio: 2 (calc) (ref 1.0–2.5)
ALT: 33 U/L (ref 9–46)
AST: 23 U/L (ref 10–40)
Albumin: 4.6 g/dL (ref 3.6–5.1)
Alkaline phosphatase (APISO): 73 U/L (ref 36–130)
BILIRUBIN TOTAL: 0.6 mg/dL (ref 0.2–1.2)
BUN: 9 mg/dL (ref 7–25)
CO2: 27 mmol/L (ref 20–32)
Calcium: 9.4 mg/dL (ref 8.6–10.3)
Chloride: 107 mmol/L (ref 98–110)
Creat: 0.95 mg/dL (ref 0.60–1.35)
GFR, EST AFRICAN AMERICAN: 112 mL/min/{1.73_m2} (ref >=60)
GFR, Est Non African American: 97 mL/min/{1.73_m2} (ref >=60)
Globulin: 2.3 g/dL (calc) (ref 1.9–3.7)
Glucose, Bld: 97 mg/dL (ref 65–99)
Potassium: 4.3 mmol/L (ref 3.5–5.3)
Sodium: 139 mmol/L (ref 135–146)
TOTAL PROTEIN: 6.9 g/dL (ref 6.1–8.1)

## 2018-10-04 LAB — CBC WITH DIFFERENTIAL/PLATELET
Absolute Monocytes: 388 cells/uL (ref 200–950)
BASOS ABS: 51 {cells}/uL (ref 0–200)
Basophils Relative: 0.9 %
Eosinophils Absolute: 108 cells/uL (ref 15–500)
Eosinophils Relative: 1.9 %
HCT: 44.9 % (ref 38.5–50.0)
HEMOGLOBIN: 15.6 g/dL (ref 13.2–17.1)
Lymphs Abs: 1568 cells/uL (ref 850–3900)
MCH: 31.8 pg (ref 27.0–33.0)
MCHC: 34.7 g/dL (ref 32.0–36.0)
MCV: 91.4 fL (ref 80.0–100.0)
MPV: 11 fL (ref 7.5–12.5)
Monocytes Relative: 6.8 %
Neutro Abs: 3585 cells/uL (ref 1500–7800)
Neutrophils Relative %: 62.9 %
Platelets: 221 10*3/uL (ref 140–400)
RBC: 4.91 10*6/uL (ref 4.20–5.80)
RDW: 12.8 % (ref 11.0–15.0)
Total Lymphocyte: 27.5 %
WBC: 5.7 10*3/uL (ref 3.8–10.8)

## 2018-10-05 NOTE — Telephone Encounter (Signed)
Labs completed on 10/04/18.

## 2018-10-12 ENCOUNTER — Other Ambulatory Visit: Payer: Self-pay | Admitting: *Deleted

## 2018-10-12 MED ORDER — METHOTREXATE 2.5 MG PO TABS: ORAL_TABLET | ORAL | 0 refills | Status: DC

## 2018-10-12 NOTE — Telephone Encounter (Signed)
Last Visit: 06/28/18 Next Visit: 11/27/18 Labs: 10/04/18 WNL  Okay to refill per Dr. Estanislado Pandy

## 2018-10-28 ENCOUNTER — Other Ambulatory Visit: Payer: Self-pay | Admitting: Rheumatology

## 2018-11-20 NOTE — Progress Notes (Signed)
Office Visit Note  Patient: Rick Santiago             Date of Birth: 09-Jan-1976           MRN: [id number]             PCP: [name], MD Referring: [name], MD Visit Date: 11/27/2018 Occupation: @GUAROCC @  Subjective:  Plantar fasciitis bilaterally    History of Present Illness: Rick Santiago is a 43 y.o. male with history of psoriatic arthritis.  He is taking MTX 5 tablets by mouth once weekly and folic acid 1 mg po daily.  He has been playing cards more frequently, and he has noticed very mild left MCP joint discomfort.  He denies any joint swelling or tenderness at this time.  He denies any other joint pain or joint swelling at this time.  He denies any SI joint pain.  He denies any Achilles tendinitis.  He has chronic plantar fasciitis bilaterally.  He wears orthotics daily.  He has a patch of psoriasis on the extensor surface of his left elbow.  He tried using a vitamin D analog cream that helped temporarily.   Activities of Daily Living:  Patient reports morning stiffness for 0 minutes.   Patient Denies nocturnal pain.  Difficulty dressing/grooming: Denies Difficulty climbing stairs: Denies Difficulty getting out of chair: Denies Difficulty using hands for taps, buttons, cutlery, and/or writing: Denies  Review of Systems  Constitutional: Negative for fatigue and night sweats.  HENT: Negative for mouth sores, mouth dryness and nose dryness.   Eyes: Negative for redness and dryness.  Respiratory: Negative for cough, hemoptysis, shortness of breath and difficulty breathing.   Cardiovascular: Negative for chest pain, palpitations, hypertension, irregular heartbeat and swelling in legs/feet.  Gastrointestinal: Negative for blood in stool, constipation and diarrhea.  Endocrine: Negative for increased urination.  Genitourinary: Negative for painful urination.  Musculoskeletal: Positive for arthralgias and joint pain. Negative for joint swelling, myalgias, muscle  weakness, morning stiffness, muscle tenderness and myalgias.  Skin: Positive for rash (Psoriasis). Negative for color change, hair loss, nodules/bumps, skin tightness, ulcers and sensitivity to sunlight.  Allergic/Immunologic: Negative for susceptible to infections.  Neurological: Negative for dizziness, fainting, memory loss, night sweats and weakness.  Hematological: Negative for swollen glands.  Psychiatric/Behavioral: Negative for depressed mood and sleep disturbance. The patient is not nervous/anxious.     PMFS History:  Patient Active Problem List   Diagnosis Date Noted  . Plantar fasciitis 09/29/2016  . Psoriasis 09/29/2016  . High risk medication use 09/29/2016  . Psoriatic arthritis ([place]) 09/14/2016  . FHx: hemochromatosis 09/14/2016  . Encounter for methotrexate monitoring 09/14/2016  . Family history of diabetes mellitus in mother 09/14/2016    Past Medical History:  Diagnosis Date  . Arthritis   . Psoriasis     Family History  Problem Relation Age of Onset  . Diabetes Mother   . Heart disease Father   . Irritable bowel syndrome Daughter    History reviewed. No pertinent surgical history. Social History   Social History Narrative  . Not on file   Immunization History  Administered Date(s) Administered  . Influenza,inj,Quad PF,6+ Mos 03/27/2017, 05/07/2018  . Influenza-Unspecified 04/10/2016  . Tdap 06/21/2018     Objective: Vital Signs: BP 104/76 (BP Location: Right Arm, Patient Position: Sitting, Cuff Size: Normal)   Pulse 83   Resp 14   Ht 5' 6.5" (1.689 m)   Wt 141 lb 3.2 oz (64 kg)   BMI  22.45 kg/m    Physical Exam Vitals signs and nursing note reviewed.  Constitutional:      Appearance: He is well-developed.  HENT:     Head: Normocephalic and atraumatic.  Eyes:     Conjunctiva/sclera: Conjunctivae normal.     Pupils: Pupils are equal, round, and reactive to light.  Neck:     Musculoskeletal: Normal range of motion and neck supple.   Cardiovascular:     Rate and Rhythm: Normal rate and regular rhythm.     Heart sounds: Normal heart sounds.  Pulmonary:     Effort: Pulmonary effort is normal.     Breath sounds: Normal breath sounds.  Abdominal:     General: Bowel sounds are normal.     Palpations: Abdomen is soft.  Lymphadenopathy:     Cervical: No cervical adenopathy.  Skin:    General: Skin is warm and dry.     Capillary Refill: Capillary refill takes less than 2 seconds.     Comments: Patch of psoriasis on the extensor surface of the left elbow joint  Neurological:     Mental Status: He is alert and oriented to person, place, and time.  Psychiatric:        Behavior: Behavior normal.      Musculoskeletal Exam: C-spine, thoracic spine, lumbar spine good range of motion.  No midline spinal tenderness.  No SI joint tenderness.  Shoulder joints, elbow joints, wrist joints, MCPs and PIPs, DIPs good range of motion no synovitis.  He has complete fist formation bilaterally.  Hip joints, knee joints, ankle joints, MTPs, PIPs, DIPs good range of motion no synovitis.  No warmth or effusion bilateral knee joints.  No tenderness or swelling of ankle joints.  No tenderness over trochanter bursa bilaterally.  CDAI Exam: CDAI Score: Not documented Patient Global Assessment: Not documented; Provider Global Assessment: Not documented Swollen: Not documented; Tender: Not documented Joint Exam   Not documented   There is currently no information documented on the homunculus. Go to the Rheumatology activity and complete the homunculus joint exam.  Investigation: No additional findings.  Imaging: No results found.  Recent Labs: Lab Results  Component Value Date   WBC 5.7 10/04/2018   HGB 15.6 10/04/2018   PLT 221 10/04/2018   NA 139 10/04/2018   K 4.3 10/04/2018   CL 107 10/04/2018   CO2 27 10/04/2018   GLUCOSE 97 10/04/2018   BUN 9 10/04/2018   CREATININE 0.95 10/04/2018   BILITOT 0.6 10/04/2018   ALKPHOS 79  06/21/2018   AST 23 10/04/2018   ALT 33 10/04/2018   PROT 6.9 10/04/2018   ALBUMIN 4.8 06/21/2018   CALCIUM 9.4 10/04/2018   GFRAA 112 10/04/2018    Speciality Comments: No specialty comments available.  Procedures:  No procedures performed Allergies: Patient has no known allergies.   Assessment / Plan:     Visit Diagnoses: Psoriatic arthritis ([place]): He has no synovitis or dactylitis on exam.  He has not had any recent psoriatic arthritis flares.  He is clinically doing well on methotrexate 5 tablets by mouth once weekly and folic acid 1 mg by mouth every 1-2 days.  He was encouraged to take folic acid 1 mg by mouth daily.  A refill sent to the pharmacy today.  He has been playing cards more frequently and has noted some increased discomfort in the left third MCP joint there is no tenderness or synovitis on exam today.  He is complete fist formation bilaterally.  He  has chronic plantar fasciitis and wears orthotics on a daily basis.  He has not noticed any worsening symptoms of plantar fasciitis or Achilles tendinitis.  He has no SI joint tenderness on exam.  He has a patch of psoriasis on the extensor surface of the left elbow joint.  He has tried topical agents in the past which have provided temporary relief.  He will continue on methotrexate 5 tablets by mouth once weekly and folic acid 1 mg by mouth daily.  He was advised to notify us if he develops increased joint pain or joint swelling.  He will follow-up in the office in 5 months.  Psoriasis: He has a patch of psoriasis on the extensor surface of the left elbow joint.  He tried using a topical vitamin D analog prescribed by his dermatologist, which provided temporary relief.   High risk medication use - He is currently taking methotrexate 5 tablets weekly and folic acid 1 mg daily.  Most recent CBC/CMP within normal limits on 10/04/2018 and will monitor every 3 months.  Standing orders are in place.  He received his flu vaccine in October  2019.   Plantar fasciitis: Chronic.  He wears orthotics daily.    Family history of psoriasis  FHx: hemochromatosis   Orders: No orders of the defined types were placed in this encounter.  Meds ordered this encounter  Medications  . folic acid (FOLVITE) 1 MG tablet    Sig: Take 1 tablet (1 mg total) by mouth daily.    Dispense:  90 tablet    Refill:  3    Face-to-face time spent with patient was 15 minutes. Greater than 50% of time was spent in counseling and coordination of care.  Follow-Up Instructions: Return in about 5 months (around 04/29/2019) for Psoriatic arthritis.   Ofilia Neas, PA-C  Note - This record has been created using Dragon software.  Chart creation errors have been sought, but may not always  have been located. Such creation errors do not reflect on  the standard of medical care.

## 2018-11-27 ENCOUNTER — Ambulatory Visit: Payer: BC Managed Care – PPO | Admitting: Physician Assistant

## 2018-11-27 ENCOUNTER — Encounter: Payer: Self-pay | Admitting: Physician Assistant

## 2018-11-27 ENCOUNTER — Other Ambulatory Visit: Payer: Self-pay

## 2018-11-27 VITALS — BP 104/76 | HR 83 | Resp 14 | Ht 66.5 in | Wt 141.2 lb

## 2018-11-27 DIAGNOSIS — L409 Psoriasis, unspecified: Secondary | ICD-10-CM | POA: Diagnosis not present

## 2018-11-27 DIAGNOSIS — M722 Plantar fascial fibromatosis: Secondary | ICD-10-CM

## 2018-11-27 DIAGNOSIS — L4050 Arthropathic psoriasis, unspecified: Secondary | ICD-10-CM | POA: Diagnosis not present

## 2018-11-27 DIAGNOSIS — Z8349 Family history of other endocrine, nutritional and metabolic diseases: Secondary | ICD-10-CM

## 2018-11-27 DIAGNOSIS — Z79899 Other long term (current) drug therapy: Secondary | ICD-10-CM | POA: Diagnosis not present

## 2018-11-27 DIAGNOSIS — Z840 Family history of diseases of the skin and subcutaneous tissue: Secondary | ICD-10-CM

## 2018-11-27 MED ORDER — FOLIC ACID 1 MG PO TABS: 1.0000 mg | ORAL_TABLET | Freq: Every day | ORAL | 3 refills | Status: DC

## 2018-11-27 NOTE — Patient Instructions (Signed)
Standing Labs We placed an order today for your standing lab work.    Please come back and get your standing labs in June and every 3 months   We have open lab Monday through Friday from 8:30-11:30 AM and 1:30-4:00 PM  at the office of Dr. Bo Merino.   You may experience shorter wait times on Monday and Friday afternoons. The office is located at [address], [place], [place], [place] [address] No appointment is necessary.   Labs are drawn by [facility].  You may receive a bill from [facility] for your lab work.  If you wish to have your labs drawn at another location, please call the office 24 hours in advance to send orders.  If you have any questions regarding directions or hours of operation,  please call [phone].   Just as a reminder please drink plenty of water prior to coming for your lab work. Thanks!

## 2019-01-03 ENCOUNTER — Other Ambulatory Visit: Payer: Self-pay

## 2019-01-03 ENCOUNTER — Other Ambulatory Visit: Payer: Self-pay | Admitting: Rheumatology

## 2019-01-03 DIAGNOSIS — Z79899 Other long term (current) drug therapy: Secondary | ICD-10-CM

## 2019-01-03 NOTE — Telephone Encounter (Addendum)
Last Visit: 11/27/18 Next visit: 05/02/19 Labs: 10/04/18 WNL  Patient advised he is due to update labs and will update today.   Okay to refill per Dr. Estanislado Pandy

## 2019-01-04 LAB — CBC WITH DIFFERENTIAL/PLATELET
Absolute Monocytes: 270 cells/uL (ref 200–950)
Basophils Absolute: 48 cells/uL (ref 0–200)
Basophils Relative: 0.9 %
Eosinophils Absolute: 90 cells/uL (ref 15–500)
Eosinophils Relative: 1.7 %
HCT: 43.9 % (ref 38.5–50.0)
Hemoglobin: 15.1 g/dL (ref 13.2–17.1)
Lymphs Abs: 1521 cells/uL (ref 850–3900)
MCH: 32.1 pg (ref 27.0–33.0)
MCHC: 34.4 g/dL (ref 32.0–36.0)
MCV: 93.2 fL (ref 80.0–100.0)
MPV: 10.6 fL (ref 7.5–12.5)
Monocytes Relative: 5.1 %
Neutro Abs: 3371 cells/uL (ref 1500–7800)
Neutrophils Relative %: 63.6 %
Platelets: 217 10*3/uL (ref 140–400)
RBC: 4.71 10*6/uL (ref 4.20–5.80)
RDW: 13.1 % (ref 11.0–15.0)
Total Lymphocyte: 28.7 %
WBC: 5.3 10*3/uL (ref 3.8–10.8)

## 2019-01-04 LAB — COMPLETE METABOLIC PANEL WITH GFR
AG Ratio: 2.1 (calc) (ref 1.0–2.5)
ALT: 19 U/L (ref 9–46)
AST: 18 U/L (ref 10–40)
Albumin: 4.5 g/dL (ref 3.6–5.1)
Alkaline phosphatase (APISO): 74 U/L (ref 36–130)
BUN: 10 mg/dL (ref 7–25)
CO2: 27 mmol/L (ref 20–32)
Calcium: 9.6 mg/dL (ref 8.6–10.3)
Chloride: 106 mmol/L (ref 98–110)
Creat: 0.83 mg/dL (ref 0.60–1.35)
GFR, Est African American: 124 mL/min/{1.73_m2} (ref >=60)
GFR, Est Non African American: 107 mL/min/{1.73_m2} (ref >=60)
Globulin: 2.1 g/dL (calc) (ref 1.9–3.7)
Glucose, Bld: 84 mg/dL (ref 65–99)
Potassium: 4.3 mmol/L (ref 3.5–5.3)
Sodium: 141 mmol/L (ref 135–146)
Total Bilirubin: 0.6 mg/dL (ref 0.2–1.2)
Total Protein: 6.6 g/dL (ref 6.1–8.1)

## 2019-02-24 ENCOUNTER — Other Ambulatory Visit: Payer: Self-pay | Admitting: Rheumatology

## 2019-04-15 ENCOUNTER — Other Ambulatory Visit: Payer: Self-pay | Admitting: Family Medicine

## 2019-04-15 ENCOUNTER — Telehealth: Payer: Self-pay | Admitting: Family Medicine

## 2019-04-15 DIAGNOSIS — L4050 Arthropathic psoriasis, unspecified: Secondary | ICD-10-CM

## 2019-04-15 DIAGNOSIS — Z0000 Encounter for general adult medical examination without abnormal findings: Secondary | ICD-10-CM

## 2019-04-15 DIAGNOSIS — Z79899 Other long term (current) drug therapy: Secondary | ICD-10-CM

## 2019-04-15 DIAGNOSIS — Z5181 Encounter for therapeutic drug level monitoring: Secondary | ICD-10-CM

## 2019-04-15 NOTE — Telephone Encounter (Signed)
Pt needs a cb to discuss which labs will be ordered for him when he comes in on 10/16. Has concerns with insurance not covering his labs and he normally discusses it with [facility] before they are ordered. cpe was coverted to a virtual. Please advise

## 2019-04-16 ENCOUNTER — Other Ambulatory Visit: Payer: Self-pay | Admitting: Rheumatology

## 2019-04-16 ENCOUNTER — Other Ambulatory Visit: Payer: Self-pay | Admitting: *Deleted

## 2019-04-16 DIAGNOSIS — Z79899 Other long term (current) drug therapy: Secondary | ICD-10-CM

## 2019-04-16 NOTE — Telephone Encounter (Signed)
Last Visit: 11/27/18 Next visit: 05/02/19 Labs: 01/03/19 WNL (patient update labs today)  Okay to refill per Dr, Estanislado Pandy

## 2019-04-16 NOTE — Telephone Encounter (Signed)
Patient called to request a refill on MTX. Patient is out, and wants to pick it up today.

## 2019-04-16 NOTE — Telephone Encounter (Signed)
Dr Nolon Rod advise please.

## 2019-04-17 LAB — CBC WITH DIFFERENTIAL/PLATELET
Absolute Monocytes: 354 cells/uL (ref 200–950)
Basophils Absolute: 70 cells/uL (ref 0–200)
Basophils Relative: 1.2 %
Eosinophils Absolute: 354 cells/uL (ref 15–500)
Eosinophils Relative: 6.1 %
HCT: 46.7 % (ref 38.5–50.0)
Hemoglobin: 15.9 g/dL (ref 13.2–17.1)
Lymphs Abs: 1531 cells/uL (ref 850–3900)
MCH: 32.1 pg (ref 27.0–33.0)
MCHC: 34 g/dL (ref 32.0–36.0)
MCV: 94.3 fL (ref 80.0–100.0)
MPV: 10.7 fL (ref 7.5–12.5)
Monocytes Relative: 6.1 %
Neutro Abs: 3492 cells/uL (ref 1500–7800)
Neutrophils Relative %: 60.2 %
Platelets: 218 10*3/uL (ref 140–400)
RBC: 4.95 10*6/uL (ref 4.20–5.80)
RDW: 12.6 % (ref 11.0–15.0)
Total Lymphocyte: 26.4 %
WBC: 5.8 10*3/uL (ref 3.8–10.8)

## 2019-04-17 LAB — COMPLETE METABOLIC PANEL WITH GFR
AG Ratio: 2 (calc) (ref 1.0–2.5)
ALT: 18 U/L (ref 9–46)
AST: 21 U/L (ref 10–40)
Albumin: 4.5 g/dL (ref 3.6–5.1)
Alkaline phosphatase (APISO): 74 U/L (ref 36–130)
BUN: 11 mg/dL (ref 7–25)
CO2: 26 mmol/L (ref 20–32)
Calcium: 9.3 mg/dL (ref 8.6–10.3)
Chloride: 105 mmol/L (ref 98–110)
Creat: 0.98 mg/dL (ref 0.60–1.35)
GFR, Est African American: 107 mL/min/{1.73_m2} (ref >=60)
GFR, Est Non African American: 93 mL/min/{1.73_m2} (ref >=60)
Globulin: 2.2 g/dL (calc) (ref 1.9–3.7)
Glucose, Bld: 95 mg/dL (ref 65–99)
Potassium: 4.4 mmol/L (ref 3.5–5.3)
Sodium: 139 mmol/L (ref 135–146)
Total Bilirubin: 0.6 mg/dL (ref 0.2–1.2)
Total Protein: 6.7 g/dL (ref 6.1–8.1)

## 2019-04-18 NOTE — Progress Notes (Signed)
Office Visit Note  Patient: Rick Santiago             Date of Birth: January 25, 1976           MRN: [id number]             PCP: [name], MD Referring: [name], MD Visit Date: 05/02/2019 Occupation: @GUAROCC @  Subjective:  Medication monitoring    History of Present Illness: Rick Santiago is a 43 y.o. male with history of psoriatic arthritis.  He is on Methotrexate 5 tablets by mouth once weekly and folic acid 1 mg po daily.  He has not missed any doses of methotrexate recently.  He has been tolerating methotrexate without any side effects he denies any recent psoriatic arthritis flares.  He has occasional SI joint pain which typically resolves in a couple days on its own.  He has a history of plantar fasciitis and wears orthotics on a daily basis.  He denies any Achilles tendinitis.  He denies any new patches of psoriasis.  He still has a few chronic patches of psoriasis on extensor surface of the left elbow joint.  He has been applying vitamin D cream which helped temporarily.  He has noticed some decreased grip strength in both hands.  He denies any pain or stiffness at this time.  He denies any joint swelling.   Activities of Daily Living:  Patient reports morning stiffness for 0 minutes.   Patient Denies nocturnal pain.  Difficulty dressing/grooming: Denies Difficulty climbing stairs: Denies Difficulty getting out of chair: Denies Difficulty using hands for taps, buttons, cutlery, and/or writing: Denies  Review of Systems  Constitutional: Negative for fatigue and night sweats.  HENT: Negative for mouth sores, mouth dryness and nose dryness.   Eyes: Negative for redness and dryness.  Respiratory: Negative for cough, hemoptysis, shortness of breath and difficulty breathing.   Cardiovascular: Negative for chest pain, palpitations, hypertension, irregular heartbeat and swelling in legs/feet.  Gastrointestinal: Negative for blood in stool, constipation and diarrhea.   Endocrine: Negative for increased urination.  Genitourinary: Negative for painful urination.  Musculoskeletal: Negative for arthralgias, joint pain, joint swelling, myalgias, muscle weakness, morning stiffness, muscle tenderness and myalgias.  Skin: Positive for rash (Psoriasis). Negative for color change, hair loss, nodules/bumps, skin tightness, ulcers and sensitivity to sunlight.  Allergic/Immunologic: Negative for susceptible to infections.  Neurological: Negative for dizziness, fainting, memory loss, night sweats and weakness.  Hematological: Negative for swollen glands.  Psychiatric/Behavioral: Negative for depressed mood and sleep disturbance. The patient is not nervous/anxious.     PMFS History:  Patient Active Problem List   Diagnosis Date Noted  . Plantar fasciitis 09/29/2016  . Psoriasis 09/29/2016  . High risk medication use 09/29/2016  . Psoriatic arthritis ([place]) 09/14/2016  . FHx: hemochromatosis 09/14/2016  . Encounter for methotrexate monitoring 09/14/2016  . Family history of diabetes mellitus in mother 09/14/2016    Past Medical History:  Diagnosis Date  . Arthritis   . Psoriasis     Family History  Problem Relation Age of Onset  . Diabetes Mother   . Heart disease Father   . Irritable bowel syndrome Daughter    No past surgical history on file. Social History   Social History Narrative  . Not on file   Immunization History  Administered Date(s) Administered  . Influenza Inj Mdck Quad Pf 03/19/2019  . Influenza,inj,Quad PF,6+ Mos 03/27/2017, 05/07/2018  . Influenza-Unspecified 04/10/2016  . Tdap 06/21/2018     Objective: Vital Signs:  There were no vitals taken for this visit.   Physical Exam Vitals signs and nursing note reviewed.  Constitutional:      Appearance: He is well-developed.  HENT:     Head: Normocephalic and atraumatic.  Eyes:     Conjunctiva/sclera: Conjunctivae normal.     Pupils: Pupils are equal, round, and reactive to light.   Neck:     Musculoskeletal: Normal range of motion and neck supple.  Cardiovascular:     Rate and Rhythm: Normal rate and regular rhythm.     Heart sounds: Normal heart sounds.  Pulmonary:     Effort: Pulmonary effort is normal.     Breath sounds: Normal breath sounds.  Abdominal:     General: Bowel sounds are normal.     Palpations: Abdomen is soft.  Skin:    General: Skin is warm and dry.     Capillary Refill: Capillary refill takes less than 2 seconds.  Neurological:     Mental Status: He is alert and oriented to person, place, and time.  Psychiatric:        Behavior: Behavior normal.      Musculoskeletal Exam: C-spine, thoracic spine, lumbar spine good range of motion.  No midline spinal tenderness.  No SI joint tenderness.  Shoulder joints, elbow joints, wrist joints, MCPs, PIPs, DIPs good range of motion with no synovitis.  He has mild PIP and DIP synovial thickening.  He has complete fist formation bilaterally.  Hip joints, knee joints, ankle joints, MTPs, PIPs, DIPs good range of motion with no synovitis.  No warmth or effusion of bilateral knee joints.  No tenderness or swelling of ankle joints.  No tenderness of Achilles tendons or plantar fascia bilaterally.   CDAI Exam: CDAI Score: - Patient Global: -; Provider Global: - Swollen: -; Tender: - Joint Exam   No joint exam has been documented for this visit   There is currently no information documented on the homunculus. Go to the Rheumatology activity and complete the homunculus joint exam.  Investigation: No additional findings.  Imaging: No results found.  Recent Labs: Lab Results  Component Value Date   WBC 6.4 04/25/2019   HGB 16.6 04/25/2019   PLT 213 04/25/2019   NA 140 04/25/2019   K 4.6 04/25/2019   CL 106 04/25/2019   CO2 21 04/25/2019   GLUCOSE 98 04/25/2019   BUN 10 04/25/2019   CREATININE 1.00 04/25/2019   BILITOT 0.4 04/25/2019   ALKPHOS 95 04/25/2019   AST 24 04/25/2019   ALT 22  04/25/2019   PROT 7.0 04/25/2019   ALBUMIN 4.9 04/25/2019   CALCIUM 9.6 04/25/2019   GFRAA 105 04/25/2019    Speciality Comments: No specialty comments available.  Procedures:  No procedures performed Allergies: Patient has no known allergies.   Assessment / Plan:     Visit Diagnoses: Psoriatic arthritis ([place]): He has no synovitis or dactylitis on exam.  He has not had any recent psoriatic arthritis flares.  He has no Achilles tendinitis or plantar fasciitis at this time.  He does have a history of plantar fasciitis and wears orthotics on a daily basis.  He has no SI joint tenderness on exam but occasionally experiences discomfort.  He was given a handout of exercises to perform.  These exercises were also demonstrated in the office.  He has been noticing some decreased grip strength but denies any joint pain, joint swelling, or morning stiffness in his hands.  He was given a handout of hand  exercises to perform.  He is clinically doing well on methotrexate 5 tablets by mouth once weekly and folic acid 1 mg by mouth daily.  He often misses a few doses of folic acid per week.  We discussed the importance of taking folic acid 1 mg by mouth daily.  He does not need any refills of these medications.  He has been tolerating them without any side effects.  He will continue on his current treatment regimen.  He was advised to notify us if he develops increased joint pain or joint swelling.  He will follow-up in the office in 5 months.  Psoriasis: He has a few small patches of psoriasis on the extensor surface of the left elbow joint.  He has been applying topical vitamin D cream which has been helping.  The patches of psoriasis on extensor surface of the right elbow joint have resolved.  High risk medication use - Methotrexate 2.5 mg 5 tablets every 7 days and folic acid 1 mg 2 tablets daily.  Most recent CBC/CMP within normal limits on 04/25/2019.  Plantar fasciitis-H/o: He wears orthotics on a daily  basis.  He has not been symptomatic recently.  He has not Achilles tendinitis.   Other medical conditions are listed as follows:  FHx: hemochromatosis  Family history of psoriasis  Orders: No orders of the defined types were placed in this encounter.  No orders of the defined types were placed in this encounter.     Follow-Up Instructions: Return in about 5 months (around 09/30/2019) for Psoriatic arthritis.   Ofilia Neas, PA-C  Note - This record has been created using Dragon software.  Chart creation errors have been sought, but may not always  have been located. Such creation errors do not reflect on  the standard of medical care.

## 2019-04-25 ENCOUNTER — Ambulatory Visit (INDEPENDENT_AMBULATORY_CARE_PROVIDER_SITE_OTHER): Payer: BC Managed Care – PPO | Admitting: Family Medicine

## 2019-04-25 ENCOUNTER — Other Ambulatory Visit: Payer: Self-pay

## 2019-04-25 DIAGNOSIS — Z5181 Encounter for therapeutic drug level monitoring: Secondary | ICD-10-CM

## 2019-04-25 DIAGNOSIS — Z79899 Other long term (current) drug therapy: Secondary | ICD-10-CM

## 2019-04-26 LAB — CMP14+EGFR
ALT: 22 IU/L (ref 0–44)
AST: 24 IU/L (ref 0–40)
Albumin/Globulin Ratio: 2.3 — ABNORMAL HIGH (ref 1.2–2.2)
Albumin: 4.9 g/dL (ref 4.0–5.0)
Alkaline Phosphatase: 95 IU/L (ref 39–117)
BUN/Creatinine Ratio: 10 (ref 9–20)
BUN: 10 mg/dL (ref 6–24)
Bilirubin Total: 0.4 mg/dL (ref 0.0–1.2)
CO2: 21 mmol/L (ref 20–29)
Calcium: 9.6 mg/dL (ref 8.7–10.2)
Chloride: 106 mmol/L (ref 96–106)
Creatinine, Ser: 1 mg/dL (ref 0.76–1.27)
GFR calc Af Amer: 105 mL/min/{1.73_m2} (ref >59)
GFR calc non Af Amer: 90 mL/min/{1.73_m2} (ref >59)
Globulin, Total: 2.1 g/dL (ref 1.5–4.5)
Glucose: 98 mg/dL (ref 65–99)
Potassium: 4.6 mmol/L (ref 3.5–5.2)
Sodium: 140 mmol/L (ref 134–144)
Total Protein: 7 g/dL (ref 6.0–8.5)

## 2019-04-26 LAB — LIPID PANEL
Chol/HDL Ratio: 3.5 ratio (ref 0.0–5.0)
Cholesterol, Total: 181 mg/dL (ref 100–199)
HDL: 51 mg/dL (ref >39)
LDL Chol Calc (NIH): 115 mg/dL — ABNORMAL HIGH (ref 0–99)
Triglycerides: 81 mg/dL (ref 0–149)
VLDL Cholesterol Cal: 15 mg/dL (ref 5–40)

## 2019-04-26 LAB — CBC
Hematocrit: 48.5 % (ref 37.5–51.0)
Hemoglobin: 16.6 g/dL (ref 13.0–17.7)
MCH: 32.3 pg (ref 26.6–33.0)
MCHC: 34.2 g/dL (ref 31.5–35.7)
MCV: 94 fL (ref 79–97)
Platelets: 213 10*3/uL (ref 150–450)
RBC: 5.14 x10E6/uL (ref 4.14–5.80)
RDW: 12.4 % (ref 11.6–15.4)
WBC: 6.4 10*3/uL (ref 3.4–10.8)

## 2019-04-30 ENCOUNTER — Telehealth (INDEPENDENT_AMBULATORY_CARE_PROVIDER_SITE_OTHER): Payer: BC Managed Care – PPO | Admitting: Family Medicine

## 2019-04-30 ENCOUNTER — Other Ambulatory Visit: Payer: Self-pay

## 2019-04-30 DIAGNOSIS — Z0000 Encounter for general adult medical examination without abnormal findings: Secondary | ICD-10-CM

## 2019-04-30 DIAGNOSIS — Z23 Encounter for immunization: Secondary | ICD-10-CM

## 2019-04-30 DIAGNOSIS — Z79899 Other long term (current) drug therapy: Secondary | ICD-10-CM

## 2019-04-30 DIAGNOSIS — Z5181 Encounter for therapeutic drug level monitoring: Secondary | ICD-10-CM

## 2019-04-30 DIAGNOSIS — Z8349 Family history of other endocrine, nutritional and metabolic diseases: Secondary | ICD-10-CM

## 2019-04-30 NOTE — Progress Notes (Signed)
CC- Annual exam- Patient had labs done on 04/25/19. Patient is doing well and do not have any other concerns at this time.

## 2019-04-30 NOTE — Progress Notes (Signed)
Reviewed labs.  Thank you for informing me!

## 2019-04-30 NOTE — Progress Notes (Signed)
CC: annual exam  Subjective:  Rick Santiago is a 43 y.o. male here for a health maintenance visit.  Patient is established pt  Patient Active Problem List   Diagnosis Date Noted  . Plantar fasciitis 09/29/2016  . Psoriasis 09/29/2016  . High risk medication use 09/29/2016  . Psoriatic arthritis ([place]) 09/14/2016  . FHx: hemochromatosis 09/14/2016  . Encounter for methotrexate monitoring 09/14/2016  . Family history of diabetes mellitus in mother 09/14/2016    Past Medical History:  Diagnosis Date  . Arthritis   . Psoriasis     No past surgical history on file.   Outpatient Medications Prior to Visit  Medication Sig Dispense Refill  . folic acid (FOLVITE) 1 MG tablet Take 1 tablet (1 mg total) by mouth daily. 90 tablet 3  . methotrexate (RHEUMATREX) 2.5 MG tablet TAKE 5 TABLETS BY MOUTH ONCE A WEEK. CAUTION:CHEMOTHERAPY. PROTECT FROM LIGHT. 60 tablet 0   No facility-administered medications prior to visit.     No Known Allergies   Family History  Problem Relation Age of Onset  . Diabetes Mother   . Heart disease Father   . Irritable bowel syndrome Daughter      Health Habits: Dental Exam: up to date Eye Exam: up to date Exercise: 5 times/week on average Current exercise activities: walking and playing with kids Diet: balanced  Social History   Socioeconomic History  . Marital status: Married    Spouse name: Not on file  . Number of children: Not on file  . Years of education: Not on file  . Highest education level: Not on file  Occupational History  . Not on file  Social Needs  . Financial resource strain: Not on file  . Food insecurity    Worry: Not on file    Inability: Not on file  . Transportation needs    Medical: Not on file    Non-medical: Not on file  Tobacco Use  . Smoking status: Never Smoker  . Smokeless tobacco: Never Used  Substance and Sexual Activity  . Alcohol use: No  . Drug use: No  . Sexual activity: Not on file   Lifestyle  . Physical activity    Days per week: Not on file    Minutes per session: Not on file  . Stress: Not on file  Relationships  . Social Herbalist on phone: Not on file    Gets together: Not on file    Attends religious service: Not on file    Active member of club or organization: Not on file    Attends meetings of clubs or organizations: Not on file    Relationship status: Not on file  . Intimate partner violence    Fear of current or ex partner: Not on file    Emotionally abused: Not on file    Physically abused: Not on file    Forced sexual activity: Not on file  Other Topics Concern  . Not on file  Social History Narrative  . Not on file   Social History   Substance and Sexual Activity  Alcohol Use No   Social History   Tobacco Use  Smoking Status Never Smoker  Smokeless Tobacco Never Used   Social History   Substance and Sexual Activity  Drug Use No    Health Maintenance: See under health Maintenance activity for review of completion dates as well. Immunization History  Administered Date(s) Administered  . Influenza Inj Mdck Quad Pf 03/19/2019  .  Influenza,inj,Quad PF,6+ Mos 03/27/2017, 05/07/2018  . Influenza-Unspecified 04/10/2016  . Tdap 06/21/2018      Depression Screen-PHQ2/9 Depression screen [facility] 2/9 06/21/2018 09/14/2016  Decreased Interest 0 0  Down, Depressed, Hopeless 0 0  PHQ - 2 Score 0 0       Depression Severity and Treatment Recommendations:  0-4= None  5-9= Mild / Treatment: Support, educate to call if worse; return in one month  10-14= Moderate / Treatment: Support, watchful waiting; Antidepressant or Psycotherapy  15-19= Moderately severe / Treatment: Antidepressant OR Psychotherapy  >= 20 = Major depression, severe / Antidepressant AND Psychotherapy    Review of Systems   ROS  See HPI for ROS as well.  Review of Systems  Constitutional: Negative for activity change, appetite change, chills and  fever.  HENT: Negative for congestion, nosebleeds, trouble swallowing and voice change.   Respiratory: Negative for cough, shortness of breath and wheezing.   Gastrointestinal: Negative for diarrhea, nausea and vomiting.  Genitourinary: Negative for difficulty urinating, dysuria, flank pain and hematuria.  Musculoskeletal: Negative for back pain, joint swelling and neck pain.  Neurological: Negative for dizziness, speech difficulty, light-headedness and numbness.  See HPI. All other review of systems negative.    Objective:   There were no vitals filed for this visit.  There is no height or weight on file to calculate BMI.  Physical Exam Alert and oriented Normal pulmonary effort   Assessment/Plan:   Patient was seen for a health maintenance exam.  Counseled the patient on health maintenance issues. Reviewed her health mainteance schedule and ordered appropriate tests (see orders.) Counseled on regular exercise and weight management. Recommend regular eye exams and dental cleaning.   The following issues were addressed today for health maintenance:   Diagnoses and all orders for this visit:  Health maintenance examination - Men's Health Maintenance Plan Advised regular exercise Advised dental exam every six months Discussed stress management  Encounter for methotrexate monitoring -  CBC and CMP reviewed and the results are normal.  Will forward to his Rheumatologist.  FHx: hemochromatosis    No follow-ups on file.    There is no height or weight on file to calculate BMI.:  Discussed the patient's BMI with patient. The BMI body mass index is unknown because there is no height or weight on file.     Future Appointments  Date Time Provider [place]  05/02/2019  3:20 PM Ofilia Neas, PA-C [facility] None    There are no Patient Instructions on file for this visit.

## 2019-05-02 ENCOUNTER — Ambulatory Visit: Payer: BC Managed Care – PPO | Admitting: Physician Assistant

## 2019-05-02 ENCOUNTER — Encounter: Payer: Self-pay | Admitting: Physician Assistant

## 2019-05-02 ENCOUNTER — Other Ambulatory Visit: Payer: Self-pay

## 2019-05-02 VITALS — BP 104/77 | HR 78 | Resp 11 | Ht 67.0 in | Wt 137.0 lb

## 2019-05-02 DIAGNOSIS — M722 Plantar fascial fibromatosis: Secondary | ICD-10-CM | POA: Diagnosis not present

## 2019-05-02 DIAGNOSIS — Z79899 Other long term (current) drug therapy: Secondary | ICD-10-CM

## 2019-05-02 DIAGNOSIS — Z840 Family history of diseases of the skin and subcutaneous tissue: Secondary | ICD-10-CM

## 2019-05-02 DIAGNOSIS — L409 Psoriasis, unspecified: Secondary | ICD-10-CM

## 2019-05-02 DIAGNOSIS — Z8349 Family history of other endocrine, nutritional and metabolic diseases: Secondary | ICD-10-CM

## 2019-05-02 DIAGNOSIS — L4050 Arthropathic psoriasis, unspecified: Secondary | ICD-10-CM

## 2019-05-02 NOTE — Patient Instructions (Addendum)
Sacroiliac Joint Dysfunction  Sacroiliac joint dysfunction is a condition that causes inflammation on one or both sides of the sacroiliac (SI) joint. The SI joint connects the lower part of the spine (sacrum) with the two upper portions of the pelvis (ilium). This condition causes deep aching or burning pain in the low back. In some cases, the pain may also spread into one or both buttocks, hips, or thighs. What are the causes? This condition may be caused by:  Pregnancy. During pregnancy, extra stress is put on the SI joints because the pelvis widens.  Injury, such as: ? Injuries from car accidents. ? Sports-related injuries. ? Work-related injuries.  Having one leg that is shorter than the other.  Conditions that affect the joints, such as: ? Rheumatoid arthritis. ? Gout. ? Psoriatic arthritis. ? Joint infection (septic arthritis). Sometimes, the cause of SI joint dysfunction is not known. What are the signs or symptoms? Symptoms of this condition include:  Aching or burning pain in the lower back. The pain may also spread to other areas, such as: ? Buttocks. ? Groin. ? Thighs.  Muscle spasms in or around the painful areas.  Increased pain when standing, walking, running, stair climbing, bending, or lifting. How is this diagnosed? This condition is diagnosed with a physical exam and medical history. During the exam, the health care provider may move one or both of your legs to different positions to check for pain. Various tests may be done to confirm the diagnosis, including:  Imaging tests to look for other causes of pain. These may include: ? MRI. ? CT scan. ? Bone scan.  Diagnostic injection. A numbing medicine is injected into the SI joint using a needle. If your pain is temporarily improved or stopped after the injection, this can indicate that SI joint dysfunction is the problem. How is this treated? Treatment depends on the cause and severity of your condition.  Treatment options may include:  Ice or heat applied to the lower back area after an injury. This may help reduce pain and muscle spasms.  Medicines to relieve pain or inflammation or to relax the muscles.  Wearing a back brace (sacroiliac brace) to help support the joint while your back is healing.  Physical therapy to increase muscle strength around the joint and flexibility at the joint. This may also involve learning proper body positions and ways of moving to relieve stress on the joint.  Direct manipulation of the SI joint.  Injections of steroid medicine into the joint to reduce pain and swelling.  Radiofrequency ablation to burn away nerves that are carrying pain messages from the joint.  Use of a device that provides electrical stimulation to help reduce pain at the joint.  Surgery to put in screws and plates that limit or prevent joint motion. This is rare. Follow these instructions at home: Medicines  Take over-the-counter and prescription medicines only as told by your health care provider.  Do not drive or use heavy machinery while taking prescription pain medicine.  If you are taking prescription pain medicine, take actions to prevent or treat constipation. Your health care provider may recommend that you: ? Drink enough fluid to keep your urine pale yellow. ? Eat foods that are high in fiber, such as fresh fruits and vegetables, whole grains, and beans. ? Limit foods that are high in fat and processed sugars, such as fried or sweet foods. ? Take an over-the-counter or prescription medicine for constipation. If you have a brace:  Wear the brace as told by your health care provider. Remove it only as told by your health care provider.  Keep the brace clean.  If the brace is not waterproof: ? Do not let it get wet. ? Cover it with a watertight covering when you take a bath or a shower. Managing pain, stiffness, and swelling      Icing can help with pain and  swelling. Heat may help with muscle tension or spasms. Ask your health care provider if you should use ice or heat.  If directed, put ice on the affected area: ? If you have a removable brace, remove it as told by your health care provider. ? Put ice in a plastic bag. ? Place a towel between your skin and the bag. ? Leave the ice on for 20 minutes, 2-3 times a day.  If directed, apply heat to the affected area. Use the heat source that your health care provider recommends, such as a moist heat pack or a heating pad. ? Place a towel between your skin and the heat source. ? Leave the heat on for 20-30 minutes. ? Remove the heat if your skin turns bright red. This is especially important if you are unable to feel pain, heat, or cold. You may have a greater risk of getting burned. General instructions  Rest as needed. Ask your health care provider what activities are safe for you.  Return to your normal activities as told by your health care provider.  Exercise as directed by your health care provider or physical therapist.  Do not use any products that contain nicotine or tobacco, such as cigarettes and e-cigarettes. These can delay bone healing. If you need help quitting, ask your health care provider.  Keep all follow-up visits as told by your health care provider. This is important. Contact a health care provider if:  Your pain is not controlled with medicine.  You have a fever.  Your pain is getting worse. Get help right away if:  You have weakness, numbness, or tingling in your legs or feet.  You lose control of your bladder or bowel. Summary  Sacroiliac joint dysfunction is a condition that causes inflammation on one or both sides of the sacroiliac (SI) joint.  This condition causes deep aching or burning pain in the low back. In some cases, the pain may also spread into one or both buttocks, hips, or thighs.  Treatment depends on the cause and severity of your condition.  It may include medicines to reduce pain and swelling or to relax muscles. This information is not intended to replace advice given to you by your health care provider. Make sure you discuss any questions you have with your health care provider. Document Released: 09/23/2008 Document Revised: 02/21/2018 Document Reviewed: 08/07/2017 Elsevier Patient Education  [employer].  Hand Exercises Hand exercises can be helpful for almost anyone. These exercises can strengthen the hands, improve flexibility and movement, and increase blood flow to the hands. These results can make work and daily tasks easier. Hand exercises can be especially helpful for people who have joint pain from arthritis or have nerve damage from overuse (carpal tunnel syndrome). These exercises can also help people who have injured a hand. Exercises Most of these hand exercises are gentle stretching and motion exercises. It is usually safe to do them often throughout the day. Warming up your hands before exercise may help to reduce stiffness. You can do this with gentle massage or by  placing your hands in warm water for 10-15 minutes. It is normal to feel some stretching, pulling, tightness, or mild discomfort as you begin new exercises. This will gradually improve. Stop an exercise right away if you feel sudden, severe pain or your pain gets worse. Ask your health care provider which exercises are best for you. Knuckle bend or "claw" fist 1. Stand or sit with your arm, hand, and all five fingers pointed straight up. Make sure to keep your wrist straight during the exercise. 2. Gently bend your fingers down toward your palm until the tips of your fingers are touching the top of your palm. Keep your big knuckle straight and just bend the small knuckles in your fingers. 3. Hold this position for __________ seconds. 4. Straighten (extend) your fingers back to the starting position. Repeat this exercise 5-10 times with each  hand. Full finger fist 1. Stand or sit with your arm, hand, and all five fingers pointed straight up. Make sure to keep your wrist straight during the exercise. 2. Gently bend your fingers into your palm until the tips of your fingers are touching the middle of your palm. 3. Hold this position for __________ seconds. 4. Extend your fingers back to the starting position, stretching every joint fully. Repeat this exercise 5-10 times with each hand. Straight fist 1. Stand or sit with your arm, hand, and all five fingers pointed straight up. Make sure to keep your wrist straight during the exercise. 2. Gently bend your fingers at the big knuckle, where your fingers meet your hand, and the middle knuckle. Keep the knuckle at the tips of your fingers straight and try to touch the bottom of your palm. 3. Hold this position for __________ seconds. 4. Extend your fingers back to the starting position, stretching every joint fully. Repeat this exercise 5-10 times with each hand. Tabletop 1. Stand or sit with your arm, hand, and all five fingers pointed straight up. Make sure to keep your wrist straight during the exercise. 2. Gently bend your fingers at the big knuckle, where your fingers meet your hand, as far down as you can while keeping the small knuckles in your fingers straight. Think of forming a tabletop with your fingers. 3. Hold this position for __________ seconds. 4. Extend your fingers back to the starting position, stretching every joint fully. Repeat this exercise 5-10 times with each hand. Finger spread 1. Place your hand flat on a table with your palm facing down. Make sure your wrist stays straight as you do this exercise. 2. Spread your fingers and thumb apart from each other as far as you can until you feel a gentle stretch. Hold this position for __________ seconds. 3. Bring your fingers and thumb tight together again. Hold this position for __________ seconds. Repeat this exercise  5-10 times with each hand. Making circles 1. Stand or sit with your arm, hand, and all five fingers pointed straight up. Make sure to keep your wrist straight during the exercise. 2. Make a circle by touching the tip of your thumb to the tip of your index finger. 3. Hold for __________ seconds. Then open your hand wide. 4. Repeat this motion with your thumb and each finger on your hand. Repeat this exercise 5-10 times with each hand. Thumb motion 1. Sit with your forearm resting on a table and your wrist straight. Your thumb should be facing up toward the ceiling. Keep your fingers relaxed as you move your thumb. 2. Lift your  thumb up as high as you can toward the ceiling. Hold for __________ seconds. 3. Bend your thumb across your palm as far as you can, reaching the tip of your thumb for the small finger (pinkie) side of your palm. Hold for __________ seconds. Repeat this exercise 5-10 times with each hand. Grip strengthening  1. Hold a stress ball or other soft ball in the middle of your hand. 2. Slowly increase the pressure, squeezing the ball as much as you can without causing pain. Think of bringing the tips of your fingers into the middle of your palm. All of your finger joints should bend when doing this exercise. 3. Hold your squeeze for __________ seconds, then relax. Repeat this exercise 5-10 times with each hand. Contact a health care provider if:  Your hand pain or discomfort gets much worse when you do an exercise.  Your hand pain or discomfort does not improve within 2 hours after you exercise. If you have any of these problems, stop doing these exercises right away. Do not do them again unless your health care provider says that you can. Get help right away if:  You develop sudden, severe hand pain or swelling. If this happens, stop doing these exercises right away. Do not do them again unless your health care provider says that you can. This information is not intended to  replace advice given to you by your health care provider. Make sure you discuss any questions you have with your health care provider. Document Released: 06/08/2015 Document Revised: 10/18/2018 Document Reviewed: 06/28/2018 Elsevier Patient Education  2020 [place] We placed an order today for your standing lab work.    Please come back and get your standing labs in January and every 3 months   We have open lab daily Monday through Thursday from 8:30-12:30 PM and 1:30-4:30 PM and Friday from 8:30-12:30 PM and 1:30-4:00 PM at the office of Dr. Bo Merino.   You may experience shorter wait times on Monday and Friday afternoons. The office is located at [address] [place], [place], [place] [address] No appointment is necessary.   Labs are drawn by [facility].  You may receive a bill from [facility] for your lab work.  If you wish to have your labs drawn at another location, please call the office 24 hours in advance to send orders.  If you have any questions regarding directions or hours of operation,  please call [phone].   Just as a reminder please drink plenty of water prior to coming for your lab work. Thanks!   Iliotibial Band Syndrome Rehab Ask your health care provider which exercises are safe for you. Do exercises exactly as told by your health care provider and adjust them as directed. It is normal to feel mild stretching, pulling, tightness, or discomfort as you do these exercises. Stop right away if you feel sudden pain or your pain gets significantly worse. Do not begin these exercises until told by your health care provider. Stretching and range-of-motion exercises These exercises warm up your muscles and joints and improve the movement and flexibility of your hip and pelvis. Quadriceps stretch, prone  1. Lie on your abdomen on a firm surface, such as a bed or padded floor (prone position). 2. Bend your left / right knee and reach back to  hold your ankle or pant leg. If you cannot reach your ankle or pant leg, loop a belt around your foot and grab the belt instead.  3. Gently pull your heel toward your buttocks. Your knee should not slide out to the side. You should feel a stretch in the front of your thigh and knee (quadriceps). 4. Hold this position for __________ seconds. Repeat __________ times. Complete this exercise __________ times a day. Iliotibial band stretch An iliotibial band is a strong band of muscle tissue that runs from the outer side of your hip to the outer side of your thigh and knee. 1. Lie on your side with your left / right leg in the top position. 2. Bend both of your knees and grab your left / right ankle. Stretch out your bottom arm to help you balance. 3. Slowly bring your top knee back so your thigh goes behind your trunk. 4. Slowly lower your top leg toward the floor until you feel a gentle stretch on the outside of your left / right hip and thigh. If you do not feel a stretch and your knee will not fall farther, place the heel of your other foot on top of your knee and pull your knee down toward the floor with your foot. 5. Hold this position for __________ seconds. Repeat __________ times. Complete this exercise __________ times a day. Strengthening exercises These exercises build strength and endurance in your hip and pelvis. Endurance is the ability to use your muscles for a long time, even after they get tired. Straight leg raises, side-lying This exercise strengthens the muscles that rotate the leg at the hip and move it away from your body (hip abductors). 1. Lie on your side with your left / right leg in the top position. Lie so your head, shoulder, hip, and knee line up. You may bend your bottom knee to help you balance. 2. Roll your hips slightly forward so your hips are stacked directly over each other and your left / right knee is facing forward. 3. Tense the muscles in your outer thigh and  lift your top leg 4-6 inches (10-15 cm). 4. Hold this position for __________ seconds. 5. Slowly return to the starting position. Let your muscles relax completely before doing another repetition. Repeat __________ times. Complete this exercise __________ times a day. Leg raises, prone This exercise strengthens the muscles that move the hips (hip extensors). 1. Lie on your abdomen on your bed or a firm surface. You can put a pillow under your hips if that is more comfortable for your lower back. 2. Bend your left / right knee so your foot is straight up in the air. 3. Squeeze your buttocks muscles and lift your left / right thigh off the bed. Do not let your back arch. 4. Tense your thigh muscle as hard as you can without increasing any knee pain. 5. Hold this position for __________ seconds. 6. Slowly lower your leg to the starting position and allow it to relax completely. Repeat __________ times. Complete this exercise __________ times a day. Hip hike 1. Stand sideways on a bottom step. Stand on your left / right leg with your other foot unsupported next to the step. You can hold on to the railing or wall for balance if needed. 2. Keep your knees straight and your torso square. Then lift your left / right hip up toward the ceiling. 3. Slowly let your left / right hip lower toward the floor, past the starting position. Your foot should get closer to the floor. Do not lean or bend your knees. Repeat __________ times. Complete this exercise __________ times a  day. This information is not intended to replace advice given to you by your health care provider. Make sure you discuss any questions you have with your health care provider. Document Released: 06/27/2005 Document Revised: 10/18/2018 Document Reviewed: 04/18/2018 Elsevier Patient Education  2020 [employer].

## 2019-05-08 ENCOUNTER — Other Ambulatory Visit: Payer: Self-pay

## 2019-05-08 ENCOUNTER — Ambulatory Visit (INDEPENDENT_AMBULATORY_CARE_PROVIDER_SITE_OTHER): Payer: BC Managed Care – PPO | Admitting: Family Medicine

## 2019-05-08 DIAGNOSIS — Z23 Encounter for immunization: Secondary | ICD-10-CM | POA: Diagnosis not present

## 2019-05-08 NOTE — Progress Notes (Signed)
First Dose today.   Next Dose at the end of November   Final dose around March.

## 2019-06-11 ENCOUNTER — Ambulatory Visit (INDEPENDENT_AMBULATORY_CARE_PROVIDER_SITE_OTHER): Payer: BC Managed Care – PPO | Admitting: Family Medicine

## 2019-06-11 ENCOUNTER — Other Ambulatory Visit: Payer: Self-pay

## 2019-06-11 DIAGNOSIS — Z23 Encounter for immunization: Secondary | ICD-10-CM

## 2019-06-11 NOTE — Patient Instructions (Signed)
gardisil 9 injection given in the left deltoid, pt tolerated well. Will be bk for 6 month dose

## 2019-07-02 ENCOUNTER — Telehealth: Payer: Self-pay | Admitting: Rheumatology

## 2019-07-02 ENCOUNTER — Encounter: Payer: Self-pay | Admitting: Family Medicine

## 2019-07-02 ENCOUNTER — Encounter: Payer: BC Managed Care – PPO | Admitting: Family Medicine

## 2019-07-02 MED ORDER — METHOTREXATE 2.5 MG PO TABS: ORAL_TABLET | ORAL | 0 refills | Status: DC

## 2019-07-02 NOTE — Telephone Encounter (Signed)
Last Visit: 05/02/2019 Next Visit: 10/03/2019 Labs: 04/25/2019 albumin/globulin ratio 2.3  Okay to refill per Dr. Estanislado Pandy.

## 2019-07-02 NOTE — Telephone Encounter (Signed)
Patient request refill on methotrexate sent to [facility] on [facility].

## 2019-07-08 ENCOUNTER — Ambulatory Visit: Payer: BC Managed Care – PPO | Attending: Internal Medicine

## 2019-07-08 DIAGNOSIS — Z20822 Contact with and (suspected) exposure to covid-19: Secondary | ICD-10-CM

## 2019-07-09 ENCOUNTER — Other Ambulatory Visit: Payer: BC Managed Care – PPO

## 2019-07-10 LAB — NOVEL CORONAVIRUS, NAA: SARS-CoV-2, NAA: NOT DETECTED

## 2019-07-16 ENCOUNTER — Telehealth: Payer: Self-pay | Admitting: Family Medicine

## 2019-07-16 NOTE — Telephone Encounter (Signed)
Pt is calling to follow up on MY CHART message sent on 07/02/2019 / could some reach out to pt. Pt prefers phone call but  , will take a MY CHART response . FR

## 2019-07-18 NOTE — Telephone Encounter (Signed)
Msg was answered by Dr Nolon Rod

## 2019-07-30 ENCOUNTER — Other Ambulatory Visit: Payer: Self-pay

## 2019-07-30 DIAGNOSIS — Z79899 Other long term (current) drug therapy: Secondary | ICD-10-CM

## 2019-07-30 LAB — CBC WITH DIFFERENTIAL/PLATELET
Absolute Monocytes: 329 cells/uL (ref 200–950)
Basophils Absolute: 38 cells/uL (ref 0–200)
Basophils Relative: 0.8 %
Eosinophils Absolute: 38 cells/uL (ref 15–500)
Eosinophils Relative: 0.8 %
HCT: 45.6 % (ref 38.5–50.0)
Hemoglobin: 15.7 g/dL (ref 13.2–17.1)
Lymphs Abs: 1372 cells/uL (ref 850–3900)
MCH: 32.6 pg (ref 27.0–33.0)
MCHC: 34.4 g/dL (ref 32.0–36.0)
MCV: 94.6 fL (ref 80.0–100.0)
MPV: 10.7 fL (ref 7.5–12.5)
Monocytes Relative: 7 %
Neutro Abs: 2923 cells/uL (ref 1500–7800)
Neutrophils Relative %: 62.2 %
Platelets: 218 10*3/uL (ref 140–400)
RBC: 4.82 10*6/uL (ref 4.20–5.80)
RDW: 13 % (ref 11.0–15.0)
Total Lymphocyte: 29.2 %
WBC: 4.7 10*3/uL (ref 3.8–10.8)

## 2019-07-30 LAB — COMPLETE METABOLIC PANEL WITH GFR
AG Ratio: 2 (calc) (ref 1.0–2.5)
ALT: 27 U/L (ref 9–46)
AST: 24 U/L (ref 10–40)
Albumin: 4.3 g/dL (ref 3.6–5.1)
Alkaline phosphatase (APISO): 74 U/L (ref 36–130)
BUN: 13 mg/dL (ref 7–25)
CO2: 30 mmol/L (ref 20–32)
Calcium: 9.4 mg/dL (ref 8.6–10.3)
Chloride: 103 mmol/L (ref 98–110)
Creat: 0.97 mg/dL (ref 0.60–1.35)
GFR, Est African American: 109 mL/min/{1.73_m2} (ref >=60)
GFR, Est Non African American: 94 mL/min/{1.73_m2} (ref >=60)
Globulin: 2.2 g/dL (calc) (ref 1.9–3.7)
Glucose, Bld: 87 mg/dL (ref 65–99)
Potassium: 4.3 mmol/L (ref 3.5–5.3)
Sodium: 138 mmol/L (ref 135–146)
Total Bilirubin: 0.7 mg/dL (ref 0.2–1.2)
Total Protein: 6.5 g/dL (ref 6.1–8.1)

## 2019-07-31 NOTE — Progress Notes (Signed)
CBC and CMP are within normal limits.

## 2019-09-19 ENCOUNTER — Ambulatory Visit: Payer: BC Managed Care – PPO | Attending: Family

## 2019-09-19 DIAGNOSIS — Z23 Encounter for immunization: Secondary | ICD-10-CM

## 2019-09-19 NOTE — Progress Notes (Signed)
   Covid-19 Vaccination Clinic  Name:  Lancer Thurner    MRN: [id number] DOB: 15-May-1976  09/19/2019  Mr. Hazel was observed post Covid-19 immunization for 15 minutes without incident. He was provided with Vaccine Information Sheet and instruction to access the V-Safe system.   Mr. Becvar was instructed to call 911 with any severe reactions post vaccine: Marland Kitchen Difficulty breathing  . Swelling of face and throat  . A fast heartbeat  . A bad rash all over body  . Dizziness and weakness   Immunizations Administered    Name Date Dose VIS Date Route   Moderna COVID-19 Vaccine 09/19/2019 11:54 AM 0.5 mL 06/11/2019 Intramuscular   Manufacturer: Moderna   Lot: [id number]   [place]: [phone]

## 2019-09-22 ENCOUNTER — Other Ambulatory Visit: Payer: Self-pay | Admitting: Rheumatology

## 2019-09-23 NOTE — Telephone Encounter (Signed)
Last Visit: 05/02/19 Next Visit: 10/03/19 Labs: 07/30/19 WNL  Current dose per last office note on 05/02/19: Methotrexate 2.5 mg 5 tablets every 7 days   Okay to refill per Dr. Estanislado Pandy

## 2019-09-26 NOTE — Progress Notes (Signed)
Office Visit Note  Patient: Rick Santiago             Date of Birth: [birth year]           MRN: [id number]             PCP: [name], MD Referring: [name], MD Visit Date: 10/03/2019 Occupation: @GUAROCC @  Subjective:  Medication monitoring   History of Present Illness: Rick Santiago is a 44 y.o. male with history of psoriatic arthritis.  Patient is taking methotrexate 5 tablets once weekly and folic acid 1 mg by mouth daily.  He reports that about 4 to 5 months ago he experienced right-sided sciatica.  He states that he has been performing exercises as well as sleeping with a pillow underneath his knees and his symptoms have resolved.  He denies any SI joint pain at this time.  He denies any other joint pain or joint swelling.  He denies any Achilles tendinitis but continues to have persistent plantar fasciitis and wears orthotics bilaterally.  He has a few scattered patches of psoriasis which she finds Vaseline to.   Activities of Daily Living:  Patient reports morning stiffness for 0 minutes.   Patient Denies nocturnal pain.  Difficulty dressing/grooming: Denies Difficulty climbing stairs: Denies Difficulty getting out of chair: Denies Difficulty using hands for taps, buttons, cutlery, and/or writing: Denies  Review of Systems  Constitutional: Negative for fatigue and night sweats.  HENT: Negative for mouth sores, mouth dryness and nose dryness.   Eyes: Negative for redness, itching and dryness.  Respiratory: Negative for shortness of breath and difficulty breathing.   Cardiovascular: Negative for chest pain, palpitations, hypertension, irregular heartbeat and swelling in legs/feet.  Gastrointestinal: Negative for blood in stool, constipation and diarrhea.  Endocrine: Negative for increased urination.  Genitourinary: Negative for difficulty urinating and painful urination.  Musculoskeletal: Negative for arthralgias, joint pain, joint swelling, myalgias, muscle  weakness, morning stiffness, muscle tenderness and myalgias.  Skin: Positive for rash. Negative for color change, hair loss, nodules/bumps, skin tightness, ulcers and sensitivity to sunlight.  Allergic/Immunologic: Negative for susceptible to infections.  Neurological: Negative for dizziness, fainting, numbness, headaches, memory loss, night sweats and weakness.  Hematological: Negative for bruising/bleeding tendency and swollen glands.  Psychiatric/Behavioral: Negative for depressed mood, confusion and sleep disturbance. The patient is not nervous/anxious.     PMFS History:  Patient Active Problem List   Diagnosis Date Noted  . Plantar fasciitis 09/29/2016  . Psoriasis 09/29/2016  . High risk medication use 09/29/2016  . Psoriatic arthritis ([place]) 09/14/2016  . FHx: hemochromatosis 09/14/2016  . Encounter for methotrexate monitoring 09/14/2016  . Family history of diabetes mellitus in mother 09/14/2016    Past Medical History:  Diagnosis Date  . Arthritis   . Psoriasis     Family History  Problem Relation Age of Onset  . Diabetes Mother   . Heart disease Father   . Irritable bowel syndrome Daughter    History reviewed. No pertinent surgical history. Social History   Social History Narrative  . Not on file   Immunization History  Administered Date(s) Administered  . HPV 9-valent 05/08/2019, 06/11/2019  . Influenza Inj Mdck Quad Pf 03/19/2019  . Influenza,inj,Quad PF,6+ Mos 03/27/2017, 05/07/2018  . Influenza-Unspecified 04/10/2016  . Moderna SARS-COVID-2 Vaccination 09/19/2019  . Tdap 06/21/2018     Objective: Vital Signs: BP 102/73 (BP Location: Right Arm, Patient Position: Sitting, Cuff Size: Normal)   Pulse 70   Resp 15   Ht  5' 7"  (1.702 m)   Wt 138 lb (62.6 kg)   BMI 21.61 kg/m    Physical Exam Vitals and nursing note reviewed.  Constitutional:      Appearance: He is well-developed.  HENT:     Head: Normocephalic and atraumatic.  Eyes:      Conjunctiva/sclera: Conjunctivae normal.     Pupils: Pupils are equal, round, and reactive to light.  Pulmonary:     Effort: Pulmonary effort is normal.  Abdominal:     General: Bowel sounds are normal.     Palpations: Abdomen is soft.  Musculoskeletal:     Cervical back: Normal range of motion and neck supple.  Skin:    General: Skin is warm and dry.     Capillary Refill: Capillary refill takes less than 2 seconds.  Neurological:     Mental Status: He is alert and oriented to person, place, and time.  Psychiatric:        Behavior: Behavior normal.      Musculoskeletal Exam: C-spine, thoracic spine, lumbar spine good range of motion.  No midline spinal tenderness.  No SI joint tenderness.  Shoulder joints, elbow joints, wrist joints, MCPs, PIPs, DIPs good range of motion with no synovitis.  He has complete fist formation bilaterally.  Hip joints, knee joints, ankle joints, MTPs, PIPs and DIPs good range of motion no synovitis.  No warmth or effusion of bilateral knee joints.  No tenderness or swelling of ankle joints.  No tenderness at the achilles tendon insertion sites bilaterally.   CDAI Exam: CDAI Score: -- Patient Global: --; Provider Global: -- Swollen: --; Tender: -- Joint Exam 10/03/2019   No joint exam has been documented for this visit   There is currently no information documented on the homunculus. Go to the Rheumatology activity and complete the homunculus joint exam.  Investigation: No additional findings.  Imaging: No results found.  Recent Labs: Lab Results  Component Value Date   WBC 4.7 07/30/2019   HGB 15.7 07/30/2019   PLT 218 07/30/2019   NA 138 07/30/2019   K 4.3 07/30/2019   CL 103 07/30/2019   CO2 30 07/30/2019   GLUCOSE 87 07/30/2019   BUN 13 07/30/2019   CREATININE 0.97 07/30/2019   BILITOT 0.7 07/30/2019   ALKPHOS 95 04/25/2019   AST 24 07/30/2019   ALT 27 07/30/2019   PROT 6.5 07/30/2019   ALBUMIN 4.9 04/25/2019   CALCIUM 9.4  07/30/2019   GFRAA 109 07/30/2019    Speciality Comments: No specialty comments available.  Procedures:  No procedures performed Allergies: Patient has no known allergies.   Assessment / Plan:     Visit Diagnoses: Psoriatic arthritis ([place]): He has no synovitis or dactylitis on exam.  He has not had any recent psoriatic arthritis flares.  He is clinically doing well on methotrexate 5 tablets by mouth once weekly and folic acid 1 mg by mouth daily.  He experienced symptoms of right-sided sciatica 4 to 5 months ago but reports that performing stretching exercises and sleeping with a pillow under his knees has resolved his discomfort.  He has no SI joint tenderness on exam today.  He has no joint pain or joint swelling currently.  He has ongoing plantar fasciitis bilaterally and wears orthotics daily which manage his symptoms.  He is not experiencing any Achilles tendinitis.  He has a few scattered patches of psoriasis which she applies Vaseline to.  He declined a prescription for a topical agent.  He will  continue on methotrexate and folic acid as prescribed.  A refill of folic acid was sent to the pharmacy today.  He was advised to notify us if he develops increased joint pain or joint swelling.  He will follow-up in the office in 5 months.  Psoriasis: He has a few scattered patches of psoriasis.  The largest patch of psoriasis on his on the extensor surface of the left elbow.  He applies Vaseline as needed.  He declined a prescription for topical agent.  High risk medication use - Methotrexate 2.5 mg 5 tablets every 7 days and folic acid 1 mg daily.  CBC and CMP within normal limits on 07/30/2019.  He is due to update lab work in April and every 3 months.  Standing orders are in place.  Plantar fasciitis - H/o: He wears orthotics on a daily basis which make his symptoms tolerable.  Chronic right-sided low back pain with right-sided sciatica: Resolved.  He experienced right-sided sciatica 4 to 5  months ago which has since resolved.  His symptoms improved with stretching exercises and sleeping with a pillow underneath his knees.  He has no SI joint tenderness on exam today.  No midline spinal tenderness.  He was advised to notify us if his symptoms return.  FHx: hemochromatosis  Family history of psoriasis  Orders: No orders of the defined types were placed in this encounter.  Meds ordered this encounter  Medications  . folic acid (FOLVITE) 1 MG tablet    Sig: Take 1 tablet (1 mg total) by mouth daily.    Dispense:  90 tablet    Refill:  3     Follow-Up Instructions: Return in about 5 months (around 03/04/2020) for Psoriatic arthritis.   Ofilia Neas, PA-C  Note - This record has been created using Dragon software.  Chart creation errors have been sought, but may not always  have been located. Such creation errors do not reflect on  the standard of medical care.

## 2019-10-03 ENCOUNTER — Encounter: Payer: Self-pay | Admitting: Physician Assistant

## 2019-10-03 ENCOUNTER — Other Ambulatory Visit: Payer: Self-pay

## 2019-10-03 ENCOUNTER — Ambulatory Visit: Payer: BC Managed Care – PPO | Admitting: Physician Assistant

## 2019-10-03 VITALS — BP 102/73 | HR 70 | Resp 15 | Ht 67.0 in | Wt 138.0 lb

## 2019-10-03 DIAGNOSIS — Z79899 Other long term (current) drug therapy: Secondary | ICD-10-CM

## 2019-10-03 DIAGNOSIS — Z8349 Family history of other endocrine, nutritional and metabolic diseases: Secondary | ICD-10-CM

## 2019-10-03 DIAGNOSIS — L409 Psoriasis, unspecified: Secondary | ICD-10-CM | POA: Diagnosis not present

## 2019-10-03 DIAGNOSIS — M722 Plantar fascial fibromatosis: Secondary | ICD-10-CM

## 2019-10-03 DIAGNOSIS — L4050 Arthropathic psoriasis, unspecified: Secondary | ICD-10-CM | POA: Diagnosis not present

## 2019-10-03 DIAGNOSIS — M5441 Lumbago with sciatica, right side: Secondary | ICD-10-CM

## 2019-10-03 DIAGNOSIS — Z840 Family history of diseases of the skin and subcutaneous tissue: Secondary | ICD-10-CM

## 2019-10-03 DIAGNOSIS — G8929 Other chronic pain: Secondary | ICD-10-CM

## 2019-10-03 MED ORDER — FOLIC ACID 1 MG PO TABS: 1.0000 mg | ORAL_TABLET | Freq: Every day | ORAL | 3 refills | Status: DC

## 2019-10-03 NOTE — Patient Instructions (Signed)
Standing Labs We placed an order today for your standing lab work.    Please come back and get your standing labs in April and every 3 months   Last labs 07/30/19   We have open lab daily Monday through Thursday from 8:30-12:30 PM and 1:30-4:30 PM and Friday from 8:30-12:30 PM and 1:30-4:00 PM at the office of Dr. Bo Merino.   You may experience shorter wait times on Monday and Friday afternoons. The office is located at [address], [place], [place], [place] [address] No appointment is necessary.   Labs are drawn by [facility].  You may receive a bill from [facility] for your lab work.  If you wish to have your labs drawn at another location, please call the office 24 hours in advance to send orders.  If you have any questions regarding directions or hours of operation,  please call [phone].   Just as a reminder please drink plenty of water prior to coming for your lab work. Thanks!

## 2019-10-22 ENCOUNTER — Ambulatory Visit: Payer: BC Managed Care – PPO | Attending: Family

## 2019-10-22 ENCOUNTER — Other Ambulatory Visit: Payer: Self-pay

## 2019-10-22 DIAGNOSIS — Z23 Encounter for immunization: Secondary | ICD-10-CM

## 2019-10-22 NOTE — Progress Notes (Signed)
   Covid-19 Vaccination Clinic  Name:  Rick Santiago    MRN: [id number] DOB: 02-18-1976  10/22/2019  [name] was observed post Covid-19 immunization for 15 minutes without incident. He was provided with Vaccine Information Sheet and instruction to access the V-Safe system.   [name] was instructed to call 911 with any severe reactions post vaccine: Marland Kitchen Difficulty breathing  . Swelling of face and throat  . A fast heartbeat  . A bad rash all over body  . Dizziness and weakness   Immunizations Administered    Name Date Dose VIS Date Route   Moderna COVID-19 Vaccine 10/22/2019  1:26 PM 0.5 mL 06/11/2019 Intramuscular   Manufacturer: Moderna   Lot: [id number]   [place]: [id number]

## 2019-11-07 ENCOUNTER — Ambulatory Visit (INDEPENDENT_AMBULATORY_CARE_PROVIDER_SITE_OTHER): Payer: BC Managed Care – PPO | Admitting: Family Medicine

## 2019-11-07 ENCOUNTER — Other Ambulatory Visit: Payer: Self-pay

## 2019-11-07 DIAGNOSIS — Z23 Encounter for immunization: Secondary | ICD-10-CM | POA: Diagnosis not present

## 2019-11-13 ENCOUNTER — Other Ambulatory Visit: Payer: Self-pay

## 2019-11-13 DIAGNOSIS — Z79899 Other long term (current) drug therapy: Secondary | ICD-10-CM

## 2019-11-14 LAB — COMPLETE METABOLIC PANEL WITH GFR
AG Ratio: 2 (calc) (ref 1.0–2.5)
ALT: 23 U/L (ref 9–46)
AST: 22 U/L (ref 10–40)
Albumin: 4.4 g/dL (ref 3.6–5.1)
Alkaline phosphatase (APISO): 86 U/L (ref 36–130)
BUN: 11 mg/dL (ref 7–25)
CO2: 23 mmol/L (ref 20–32)
Calcium: 9.1 mg/dL (ref 8.6–10.3)
Chloride: 108 mmol/L (ref 98–110)
Creat: 0.8 mg/dL (ref 0.60–1.35)
GFR, Est African American: 125 mL/min/{1.73_m2} (ref >=60)
GFR, Est Non African American: 108 mL/min/{1.73_m2} (ref >=60)
Globulin: 2.2 g/dL (calc) (ref 1.9–3.7)
Glucose, Bld: 86 mg/dL (ref 65–99)
Potassium: 4.4 mmol/L (ref 3.5–5.3)
Sodium: 139 mmol/L (ref 135–146)
Total Bilirubin: 0.7 mg/dL (ref 0.2–1.2)
Total Protein: 6.6 g/dL (ref 6.1–8.1)

## 2019-11-14 LAB — CBC WITH DIFFERENTIAL/PLATELET
Absolute Monocytes: 400 cells/uL (ref 200–950)
Basophils Absolute: 70 cells/uL (ref 0–200)
Basophils Relative: 1.3 %
Eosinophils Absolute: 151 cells/uL (ref 15–500)
Eosinophils Relative: 2.8 %
HCT: 44 % (ref 38.5–50.0)
Hemoglobin: 15.2 g/dL (ref 13.2–17.1)
Lymphs Abs: 1183 cells/uL (ref 850–3900)
MCH: 32.8 pg (ref 27.0–33.0)
MCHC: 34.5 g/dL (ref 32.0–36.0)
MCV: 95 fL (ref 80.0–100.0)
MPV: 11.4 fL (ref 7.5–12.5)
Monocytes Relative: 7.4 %
Neutro Abs: 3596 cells/uL (ref 1500–7800)
Neutrophils Relative %: 66.6 %
Platelets: 221 10*3/uL (ref 140–400)
RBC: 4.63 10*6/uL (ref 4.20–5.80)
RDW: 12.8 % (ref 11.0–15.0)
Total Lymphocyte: 21.9 %
WBC: 5.4 10*3/uL (ref 3.8–10.8)

## 2019-11-14 NOTE — Progress Notes (Signed)
CBC and CMP are within normal limits.

## 2019-12-11 ENCOUNTER — Other Ambulatory Visit: Payer: Self-pay | Admitting: Rheumatology

## 2019-12-11 NOTE — Telephone Encounter (Signed)
Last Visit: 10/03/2019 Next Visit: 03/05/2020 Labs: 11/13/2019 CBC and CMP are within normal limits.  Current Dose per office note 10/03/2019: Methotrexate 2.5 mg 5 tablets every 7 days   Okay to refill per Dr. Estanislado Pandy

## 2019-12-12 ENCOUNTER — Ambulatory Visit: Payer: BC Managed Care – PPO

## 2020-02-20 NOTE — Progress Notes (Signed)
Office Visit Note  Patient: Rick Santiago             Date of Birth: [birth year]           MRN: [id number]             PCP: [name], MD Referring: [name], MD Visit Date: 03/05/2020 Occupation: @GUAROCC @  Subjective:  Medication monitoring   History of Present Illness: Cleven Jansma is a 44 y.o. male with history of psoriatic arthritis. Patient is taking methotrexate 5 tablets by mouth once weekly and folic acid 1 mg by mouth daily.  He denies any recent psoriatic arthritis-itis flares.  He states that he has been performing daily exercises and has not had any recent SI joint pain.  He states that he wears orthotics on a daily basis which controls his plantar fasciitis.  He denies any Achilles tendinitis at this time.  He continues to have small patches of psoriasis on the extensor surface of the left elbow but denies any new patches.  He is not experiencing any joint pain or joint swelling at this time. He has received both COVID-19 vaccinations and is planning on receiving the third dose.  He has not had any recent infections.   Activities of Daily Living:  Patient reports morning stiffness for 0 minutes.   Patient Denies nocturnal pain.  Difficulty dressing/grooming: Denies Difficulty climbing stairs: Denies Difficulty getting out of chair: Denies Difficulty using hands for taps, buttons, cutlery, and/or writing: Denies  Review of Systems  Constitutional: Negative for fatigue and night sweats.  HENT: Negative for mouth sores, mouth dryness and nose dryness.   Eyes: Negative for redness and dryness.  Respiratory: Negative for shortness of breath and difficulty breathing.   Cardiovascular: Negative for chest pain, palpitations, hypertension, irregular heartbeat and swelling in legs/feet.  Gastrointestinal: Negative for constipation and diarrhea.  Endocrine: Negative for increased urination.  Genitourinary: Negative for painful urination.  Musculoskeletal:  Negative for arthralgias, joint pain, joint swelling, myalgias, muscle weakness, morning stiffness, muscle tenderness and myalgias.  Skin: Negative for color change, rash, hair loss, nodules/bumps, skin tightness, ulcers and sensitivity to sunlight.  Allergic/Immunologic: Negative for susceptible to infections.  Neurological: Negative for dizziness, fainting, memory loss, night sweats and weakness.  Hematological: Negative for swollen glands.  Psychiatric/Behavioral: Negative for depressed mood, confusion and sleep disturbance. The patient is not nervous/anxious.     PMFS History:  Patient Active Problem List   Diagnosis Date Noted  . Plantar fasciitis 09/29/2016  . Psoriasis 09/29/2016  . High risk medication use 09/29/2016  . Psoriatic arthritis ([place]) 09/14/2016  . FHx: hemochromatosis 09/14/2016  . Encounter for methotrexate monitoring 09/14/2016  . Family history of diabetes mellitus in mother 09/14/2016    Past Medical History:  Diagnosis Date  . Arthritis   . Psoriasis     Family History  Problem Relation Age of Onset  . Diabetes Mother   . Heart disease Father   . Irritable bowel syndrome Daughter    History reviewed. No pertinent surgical history. Social History   Social History Narrative  . Not on file   Immunization History  Administered Date(s) Administered  . HPV 9-valent 05/08/2019, 06/11/2019, 11/07/2019  . Hpv 05/08/2019, 06/11/2019, 11/07/2019  . Influenza Inj Mdck Quad Pf 03/19/2019  . Influenza,inj,Quad PF,6+ Mos 03/27/2017, 05/07/2018  . Influenza-Unspecified 04/10/2016  . Moderna SARS-COVID-2 Vaccination 09/19/2019, 10/22/2019  . Tdap 06/21/2018     Objective: Vital Signs: BP 123/83 (BP Location: Left Arm, Patient  Position: Sitting, Cuff Size: Normal)   Pulse 65   Resp 14   Ht 5' 7"  (1.702 m)   Wt 138 lb (62.6 kg)   BMI 21.61 kg/m    Physical Exam Vitals and nursing note reviewed.  Constitutional:      Appearance: He is well-developed.    HENT:     Head: Normocephalic and atraumatic.  Eyes:     Conjunctiva/sclera: Conjunctivae normal.     Pupils: Pupils are equal, round, and reactive to light.  Pulmonary:     Effort: Pulmonary effort is normal.  Abdominal:     Palpations: Abdomen is soft.  Musculoskeletal:     Cervical back: Normal range of motion and neck supple.  Skin:    General: Skin is warm and dry.     Capillary Refill: Capillary refill takes less than 2 seconds.  Neurological:     Mental Status: He is alert and oriented to person, place, and time.  Psychiatric:        Behavior: Behavior normal.      Musculoskeletal Exam: C-spine, thoracic spine, lumbar spine have good range of motion.  No midline spinal tenderness.  No SI joint tenderness.  Shoulder joints, elbow joints, wrist joints, MCPs, PIPs, DIPs have good range of motion with no synovitis.  He has complete fist formation bilaterally.  Hip joints have good range of motion with no discomfort.  Knee joints have good range of motion with no warmth or effusion. Ankle joints good ROM with no tenderness or inflammation.   CDAI Exam: CDAI Score: -- Patient Global: --; Provider Global: -- Swollen: --; Tender: -- Joint Exam 03/05/2020   No joint exam has been documented for this visit   There is currently no information documented on the homunculus. Go to the Rheumatology activity and complete the homunculus joint exam.  Investigation: No additional findings.  Imaging: No results found.  Recent Labs: Lab Results  Component Value Date   WBC 4.8 02/21/2020   HGB 14.9 02/21/2020   PLT 214 02/21/2020   NA 137 02/21/2020   K 4.1 02/21/2020   CL 104 02/21/2020   CO2 27 02/21/2020   GLUCOSE 88 02/21/2020   BUN 11 02/21/2020   CREATININE 0.81 02/21/2020   BILITOT 0.7 02/21/2020   ALKPHOS 95 04/25/2019   AST 23 02/21/2020   ALT 31 02/21/2020   PROT 6.5 02/21/2020   ALBUMIN 4.9 04/25/2019   CALCIUM 9.1 02/21/2020   GFRAA 124 02/21/2020     Speciality Comments: No specialty comments available.  Procedures:  No procedures performed Allergies: Patient has no known allergies.   Assessment / Plan:     Visit Diagnoses: Psoriatic arthritis ([place]): He has no synovitis or dactylitis on exam.  He has not had any recent psoriatic arthritis flares.  He is clinically doing well on methotrexate 5 tablets by mouth once weekly and folic acid 1 mg by mouth daily.  He is not experiencing any joint pain or inflammation at this time.  He does not have any morning stiffness or nocturnal pain.  He has been performing SI joint exercises on a daily basis and has not had any flares recently.  He is not having any discomfort due to Achilles tendinitis or plantar fasciitis.  He continues to wear orthotics on a daily basis.  He will continue taking methotrexate 5 tablets by mouth once weekly and folic acid 1 mg by mouth daily.  He does not need any refills at this time.  He was  advised to notify us if he develops increased joint pain or joint swelling.  He will follow-up in the office in 5 months.  Psoriasis: He has a few small patches of psoriasis on the extensor surface of the left elbow.  He has not developed any new patches.  He will continue taking MTX as prescribed.   High risk medication use - Methotrexate 2.5 mg 5 tablets every 7 days and folic acid 1 mg daily.  CBC and CMP were within normal limits on 02/21/2020.  He will be due to update lab work in November and every 3 months to monitor for drug toxicity.  Standing orders for CBC and CMP were placed today.- Plan: CBC with Differential/Platelet, COMPLETE METABOLIC PANEL WITH GFR He has not had any recent infections.  He was advised to hold methotrexate if he develops signs or symptoms of an infection and to resume once the infection has completely cleared. He has received both COVID-19 vaccinations and was encouraged to receive the third dose.  He was advised to avoid taking NSAIDs and Tylenol 24 hours  prior to this dose.  He was advised to hold methotrexate 1 week after receiving the third dose.  He was advised to notify us or his PCP if he develops the COVID-19 infection in order to receive the antibody infusion.  He was encouraged to continue to wear a mask and social distance.  All questions were addressed and he voiced understanding.  Plantar fasciitis - H/o: He wears orthotics on a daily basis which manage his discomfort.   Chronic right-sided low back pain with right-sided sciatica - Resolved.  No midline spinal tenderness.  He is not experiencing any symptoms of sciatica.  He has been performing lower back exercises on a daily basis.  FHx: hemochromatosis-Mother-Iron is checked yearly by PCP.   Family history of psoriasis  Orders: Orders Placed This Encounter  Procedures  . CBC with Differential/Platelet  . COMPLETE METABOLIC PANEL WITH GFR   No orders of the defined types were placed in this encounter.     Follow-Up Instructions: Return in about 5 months (around 08/05/2020) for Psoriatic arthritis.   Ofilia Neas, PA-C  Note - This record has been created using Dragon software.  Chart creation errors have been sought, but may not always  have been located. Such creation errors do not reflect on  the standard of medical care.

## 2020-02-21 ENCOUNTER — Other Ambulatory Visit: Payer: Self-pay | Admitting: *Deleted

## 2020-02-21 DIAGNOSIS — Z79899 Other long term (current) drug therapy: Secondary | ICD-10-CM

## 2020-02-21 LAB — COMPLETE METABOLIC PANEL WITH GFR
AG Ratio: 2.1 (calc) (ref 1.0–2.5)
ALT: 31 U/L (ref 9–46)
AST: 23 U/L (ref 10–40)
Albumin: 4.4 g/dL (ref 3.6–5.1)
Alkaline phosphatase (APISO): 70 U/L (ref 36–130)
BUN: 11 mg/dL (ref 7–25)
CO2: 27 mmol/L (ref 20–32)
Calcium: 9.1 mg/dL (ref 8.6–10.3)
Chloride: 104 mmol/L (ref 98–110)
Creat: 0.81 mg/dL (ref 0.60–1.35)
GFR, Est African American: 124 mL/min/{1.73_m2} (ref >=60)
GFR, Est Non African American: 107 mL/min/{1.73_m2} (ref >=60)
Globulin: 2.1 g/dL (calc) (ref 1.9–3.7)
Glucose, Bld: 88 mg/dL (ref 65–99)
Potassium: 4.1 mmol/L (ref 3.5–5.3)
Sodium: 137 mmol/L (ref 135–146)
Total Bilirubin: 0.7 mg/dL (ref 0.2–1.2)
Total Protein: 6.5 g/dL (ref 6.1–8.1)

## 2020-02-21 LAB — CBC WITH DIFFERENTIAL/PLATELET
Absolute Monocytes: 341 cells/uL (ref 200–950)
Basophils Absolute: 38 cells/uL (ref 0–200)
Basophils Relative: 0.8 %
Eosinophils Absolute: 19 cells/uL (ref 15–500)
Eosinophils Relative: 0.4 %
HCT: 43.6 % (ref 38.5–50.0)
Hemoglobin: 14.9 g/dL (ref 13.2–17.1)
Lymphs Abs: 1229 cells/uL (ref 850–3900)
MCH: 32.3 pg (ref 27.0–33.0)
MCHC: 34.2 g/dL (ref 32.0–36.0)
MCV: 94.4 fL (ref 80.0–100.0)
MPV: 10.4 fL (ref 7.5–12.5)
Monocytes Relative: 7.1 %
Neutro Abs: 3173 cells/uL (ref 1500–7800)
Neutrophils Relative %: 66.1 %
Platelets: 214 10*3/uL (ref 140–400)
RBC: 4.62 10*6/uL (ref 4.20–5.80)
RDW: 12.7 % (ref 11.0–15.0)
Total Lymphocyte: 25.6 %
WBC: 4.8 10*3/uL (ref 3.8–10.8)

## 2020-02-23 NOTE — Progress Notes (Signed)
CBC and CMP are normal.

## 2020-03-01 ENCOUNTER — Other Ambulatory Visit: Payer: Self-pay | Admitting: Rheumatology

## 2020-03-02 NOTE — Telephone Encounter (Signed)
Last Visit: 10/03/2019 Next Visit: 03/05/2020 Labs: 02/21/2020 CBC and CMP are normal.  Current Dose per office note 10/03/2019: Methotrexate 2.5 mg 5 tablets every 7 days   Okay to refill per Dr. Estanislado Pandy

## 2020-03-05 ENCOUNTER — Encounter: Payer: Self-pay | Admitting: Physician Assistant

## 2020-03-05 ENCOUNTER — Ambulatory Visit: Payer: BC Managed Care – PPO | Admitting: Physician Assistant

## 2020-03-05 ENCOUNTER — Other Ambulatory Visit: Payer: Self-pay

## 2020-03-05 VITALS — BP 123/83 | HR 65 | Resp 14 | Ht 67.0 in | Wt 138.0 lb

## 2020-03-05 DIAGNOSIS — M722 Plantar fascial fibromatosis: Secondary | ICD-10-CM | POA: Diagnosis not present

## 2020-03-05 DIAGNOSIS — G8929 Other chronic pain: Secondary | ICD-10-CM

## 2020-03-05 DIAGNOSIS — L409 Psoriasis, unspecified: Secondary | ICD-10-CM | POA: Diagnosis not present

## 2020-03-05 DIAGNOSIS — Z79899 Other long term (current) drug therapy: Secondary | ICD-10-CM | POA: Diagnosis not present

## 2020-03-05 DIAGNOSIS — L4050 Arthropathic psoriasis, unspecified: Secondary | ICD-10-CM | POA: Diagnosis not present

## 2020-03-05 DIAGNOSIS — Z8349 Family history of other endocrine, nutritional and metabolic diseases: Secondary | ICD-10-CM

## 2020-03-05 DIAGNOSIS — Z840 Family history of diseases of the skin and subcutaneous tissue: Secondary | ICD-10-CM

## 2020-03-05 DIAGNOSIS — M5441 Lumbago with sciatica, right side: Secondary | ICD-10-CM

## 2020-03-05 NOTE — Patient Instructions (Signed)
COVID-19 vaccine recommendations:   COVID-19 vaccine is recommended for everyone (unless you are allergic to a vaccine component), even if you are on a medication that suppresses your immune system.   If you are on Methotrexate, Cellcept (mycophenolate), Rinvoq, Morrie Sheldon, and Olumiant- hold the medication for 1 week after each vaccine. Hold Methotrexate for 2 weeks after the single dose COVID-19 vaccine.   If you are on Orencia subcutaneous injection - hold medication one week prior to and one week after the first COVID-19 vaccine dose (only).   If you are on Orencia IV infusions- time vaccination administration so that the first COVID-19 vaccination will occur four weeks after the infusion and postpone the subsequent infusion by one week.   If you are on Cyclophosphamide or Rituxan infusions please contact your doctor prior to receiving the COVID-19 vaccine.   Do not take Tylenol or any anti-inflammatory medications (NSAIDs) 24 hours prior to the COVID-19 vaccination.   There is no direct evidence about the efficacy of the COVID-19 vaccine in individuals who are on medications that suppress the immune system.   Even if you are fully vaccinated, and you are on any medications that suppress your immune system, please continue to wear a mask, maintain at least six feet social distance and practice hand hygiene.   If you develop a COVID-19 infection, please contact your PCP or our office to determine if you need antibody infusion.  The booster vaccine is now available for immunocompromised patients. It is advised that if you had Pfizer vaccine you should get [employer] booster.  If you had a Moderna vaccine then you should get a Moderna booster. Johnson and Wynetta Emery does not have a booster vaccine at this time.  Please see the following web sites for updated information.    [email or web address]://[email or web address]/[email or web address]-[email or web address]-Patient-Resources.[email or web address]  [email or web address]://[email or web address]/[email or web address]  Standing Labs We placed an order today for your standing lab work.   Please have your standing labs drawn in November and every 3 months   If possible, please have your labs drawn 2 weeks prior to your appointment so that the provider can discuss your results at your appointment.  We have open lab daily Monday through Thursday from 8:30-12:30 PM and 1:30-4:30 PM and Friday from 8:30-12:30 PM and 1:30-4:00 PM at the office of Dr. Bo Merino, [facility].   Please be advised, patients with office appointments requiring lab work will take precedents over walk-in lab work.  If possible, please come for your lab work on Monday and Friday afternoons, as you may experience shorter wait times. The office is located at [address] [place], [place], [place] [address] No appointment is necessary.   Labs are drawn by [facility]. Please bring your co-pay at the time of your lab draw.  You may receive a bill from [facility] for your lab work.  If you wish to have your labs drawn at another location, please call the office 24 hours in advance to send orders.  If you have any questions regarding directions or hours of operation,  please call [phone].   As a reminder, please drink plenty of water prior to coming for your lab work. Thanks!

## 2020-05-13 ENCOUNTER — Ambulatory Visit: Payer: BC Managed Care – PPO | Attending: Family

## 2020-05-13 DIAGNOSIS — Z23 Encounter for immunization: Secondary | ICD-10-CM

## 2020-05-27 ENCOUNTER — Other Ambulatory Visit: Payer: Self-pay | Admitting: *Deleted

## 2020-05-27 ENCOUNTER — Other Ambulatory Visit: Payer: Self-pay | Admitting: Rheumatology

## 2020-05-27 DIAGNOSIS — Z79899 Other long term (current) drug therapy: Secondary | ICD-10-CM

## 2020-05-27 NOTE — Telephone Encounter (Signed)
Last Visit: 03/05/2020 Next Visit: 08/06/2020 Labs: 02/21/2020 CBC and CMP are normal.  Current Dose per office note on  03/05/2020: Methotrexate 2.5 mg 5 tablets every 7 days  Dx: Psoriatic arthritis   Patient advised he is due to update labs. Patient will try to come to update labs today.   Okay to refill MTX?

## 2020-05-28 LAB — COMPLETE METABOLIC PANEL WITH GFR
AG Ratio: 2 (calc) (ref 1.0–2.5)
ALT: 17 U/L (ref 9–46)
AST: 19 U/L (ref 10–40)
Albumin: 4.5 g/dL (ref 3.6–5.1)
Alkaline phosphatase (APISO): 80 U/L (ref 36–130)
BUN: 13 mg/dL (ref 7–25)
CO2: 28 mmol/L (ref 20–32)
Calcium: 9.3 mg/dL (ref 8.6–10.3)
Chloride: 102 mmol/L (ref 98–110)
Creat: 0.88 mg/dL (ref 0.60–1.35)
GFR, Est African American: 119 mL/min/{1.73_m2} (ref >=60)
GFR, Est Non African American: 103 mL/min/{1.73_m2} (ref >=60)
Globulin: 2.2 g/dL (calc) (ref 1.9–3.7)
Glucose, Bld: 84 mg/dL (ref 65–139)
Potassium: 4.6 mmol/L (ref 3.5–5.3)
Sodium: 137 mmol/L (ref 135–146)
Total Bilirubin: 0.6 mg/dL (ref 0.2–1.2)
Total Protein: 6.7 g/dL (ref 6.1–8.1)

## 2020-05-28 LAB — CBC WITH DIFFERENTIAL/PLATELET
Absolute Monocytes: 397 cells/uL (ref 200–950)
Basophils Absolute: 43 cells/uL (ref 0–200)
Basophils Relative: 0.7 %
Eosinophils Absolute: 49 cells/uL (ref 15–500)
Eosinophils Relative: 0.8 %
HCT: 42.3 % (ref 38.5–50.0)
Hemoglobin: 15.1 g/dL (ref 13.2–17.1)
Lymphs Abs: 1537 cells/uL (ref 850–3900)
MCH: 33.6 pg — ABNORMAL HIGH (ref 27.0–33.0)
MCHC: 35.7 g/dL (ref 32.0–36.0)
MCV: 94 fL (ref 80.0–100.0)
MPV: 10.3 fL (ref 7.5–12.5)
Monocytes Relative: 6.5 %
Neutro Abs: 4075 cells/uL (ref 1500–7800)
Neutrophils Relative %: 66.8 %
Platelets: 249 10*3/uL (ref 140–400)
RBC: 4.5 10*6/uL (ref 4.20–5.80)
RDW: 12.2 % (ref 11.0–15.0)
Total Lymphocyte: 25.2 %
WBC: 6.1 10*3/uL (ref 3.8–10.8)

## 2020-05-28 NOTE — Progress Notes (Signed)
CBC and CMP WNL

## 2020-07-20 ENCOUNTER — Other Ambulatory Visit: Payer: BC Managed Care – PPO

## 2020-07-24 NOTE — Progress Notes (Deleted)
Office Visit Note  Patient: Rick Santiago             Date of Birth: [birth year]           MRN: [id number]             PCP: [name], MD Referring: [name], MD Visit Date: 08/06/2020 Occupation: @GUAROCC @  Subjective:  No chief complaint on file.   History of Present Illness: Rick Santiago is a 45 y.o. male ***   Activities of Daily Living:  Patient reports morning stiffness for *** {minute/hour:19697}.   Patient {ACTIONS;DENIES/REPORTS:21021675::"Denies"} nocturnal pain.  Difficulty dressing/grooming: {ACTIONS;DENIES/REPORTS:21021675::"Denies"} Difficulty climbing stairs: {ACTIONS;DENIES/REPORTS:21021675::"Denies"} Difficulty getting out of chair: {ACTIONS;DENIES/REPORTS:21021675::"Denies"} Difficulty using hands for taps, buttons, cutlery, and/or writing: {ACTIONS;DENIES/REPORTS:21021675::"Denies"}  No Rheumatology ROS completed.   PMFS History:  Patient Active Problem List   Diagnosis Date Noted  . Plantar fasciitis 09/29/2016  . Psoriasis 09/29/2016  . High risk medication use 09/29/2016  . Psoriatic arthritis ([place]) 09/14/2016  . FHx: hemochromatosis 09/14/2016  . Encounter for methotrexate monitoring 09/14/2016  . Family history of diabetes mellitus in mother 09/14/2016    Past Medical History:  Diagnosis Date  . Arthritis   . Psoriasis     Family History  Problem Relation Age of Onset  . Diabetes Mother   . Heart disease Father   . Irritable bowel syndrome Daughter    No past surgical history on file. Social History   Social History Narrative  . Not on file   Immunization History  Administered Date(s) Administered  . HPV 9-valent 05/08/2019, 06/11/2019, 11/07/2019  . Hpv 05/08/2019, 06/11/2019, 11/07/2019  . Influenza Inj Mdck Quad Pf 03/19/2019  . Influenza,inj,Quad PF,6+ Mos 03/27/2017, 05/07/2018  . Influenza-Unspecified 04/10/2016  . Moderna SARS-COV2 Booster Vaccination 05/13/2020  . Moderna Sars-Covid-2 Vaccination  09/19/2019, 10/22/2019  . Tdap 06/21/2018     Objective: Vital Signs: There were no vitals taken for this visit.   Physical Exam   Musculoskeletal Exam: ***  CDAI Exam: CDAI Score: -- Patient Global: --; Provider Global: -- Swollen: --; Tender: -- Joint Exam 08/06/2020   No joint exam has been documented for this visit   There is currently no information documented on the homunculus. Go to the Rheumatology activity and complete the homunculus joint exam.  Investigation: No additional findings.  Imaging: No results found.  Recent Labs: Lab Results  Component Value Date   WBC 6.1 05/27/2020   HGB 15.1 05/27/2020   PLT 249 05/27/2020   NA 137 05/27/2020   K 4.6 05/27/2020   CL 102 05/27/2020   CO2 28 05/27/2020   GLUCOSE 84 05/27/2020   BUN 13 05/27/2020   CREATININE 0.88 05/27/2020   BILITOT 0.6 05/27/2020   ALKPHOS 95 04/25/2019   AST 19 05/27/2020   ALT 17 05/27/2020   PROT 6.7 05/27/2020   ALBUMIN 4.9 04/25/2019   CALCIUM 9.3 05/27/2020   GFRAA 119 05/27/2020    Speciality Comments: No specialty comments available.  Procedures:  No procedures performed Allergies: Patient has no known allergies.   Assessment / Plan:     Visit Diagnoses: No diagnosis found.  Orders: No orders of the defined types were placed in this encounter.  No orders of the defined types were placed in this encounter.   Face-to-face time spent with patient was *** minutes. Greater than 50% of time was spent in counseling and coordination of care.  Follow-Up Instructions: No follow-ups on file.   Earnestine Mealing, CMA  Note -  This record has been created using [employer].  Chart creation errors have been sought, but may not always  have been located. Such creation errors do not reflect on  the standard of medical care.

## 2020-07-25 NOTE — Progress Notes (Signed)
   Covid-19 Vaccination Clinic  Name:  Christien Frankl    MRN: [id number] DOB: 1976-01-06  07/25/2020  Mr. Jelley was observed post Covid-19 immunization for 15 minutes without incident. He was provided with Vaccine Information Sheet and instruction to access the V-Safe system.   Mr. Juenger was instructed to call 911 with any severe reactions post vaccine: Marland Kitchen Difficulty breathing  . Swelling of face and throat  . A fast heartbeat  . A bad rash all over body  . Dizziness and weakness   Immunizations Administered    Name Date Dose VIS Date Route   Moderna Covid-19 Booster Vaccine 05/13/2020  4:55 PM 0.25 mL 04/29/2020 Intramuscular   Manufacturer: Moderna   Lot: [id number]   [place]: [id number]

## 2020-08-03 NOTE — Progress Notes (Signed)
Office Visit Note  Patient: Rick Santiago             Date of Birth: 1975/12/01           MRN: [id number]             PCP: [name], MD Referring: [name], MD Visit Date: 08/11/2020 Occupation: @GUAROCC @  Subjective:  Medication monitoring   History of Present Illness: Rick Santiago is a 45 y.o. male with history of psoriatic arthritis.  He is taking methotrexate 5 tablets by mouth once weekly and folic acid 1 mg po daily.  He denies any recent psoriatic arthritis flares.  He has not missed any doses of methotrexate recently.  He states that he was diagnosed with a COVID-19 infection on 07/19/2020 and states that his symptoms were mild overall.  He did not hold methotrexate during this time due to his symptoms resolving within several days.  He still has an occasional cough but no congestion, fever, or body aches. His SI joint pain has improved significantly since performing daily stretching exercises.  He continues to wear orthotics in his shoes which has alleviated some of his discomfort secondary to plantar fasciitis.  He was previously using custom made orthotics for about 10 years but has since switched to over-the-counter orthotics.  He denies any increased discomfort due to Achilles tendinitis.  He has not had any other joint pain or joint swelling.  The patches of psoriasis on his left elbow, face, and scalp are unchanged.  He follows up with his dermatologist on a regular basis.  He has a prescription for triamcinolone which he uses if the patches of psoriasis on his face flareup.     Activities of Daily Living:  Patient reports morning stiffness for 0 minutes.   Patient Denies nocturnal pain.  Difficulty dressing/grooming: Denies Difficulty climbing stairs: Denies Difficulty getting out of chair: Denies Difficulty using hands for taps, buttons, cutlery, and/or writing: Denies  Review of Systems  Constitutional: Negative for fatigue.  HENT: Negative for mouth  sores, mouth dryness and nose dryness.   Eyes: Positive for dryness. Negative for pain and itching.  Respiratory: Negative for shortness of breath and difficulty breathing.   Cardiovascular: Negative for chest pain and palpitations.  Gastrointestinal: Negative for blood in stool, constipation and diarrhea.  Endocrine: Negative for increased urination.  Genitourinary: Negative for difficulty urinating.  Musculoskeletal: Negative for arthralgias, joint pain, joint swelling, myalgias, morning stiffness, muscle tenderness and myalgias.  Skin: Negative for color change, rash and redness.  Allergic/Immunologic: Negative for susceptible to infections.  Neurological: Negative for dizziness, numbness, headaches, memory loss and weakness.  Hematological: Negative for bruising/bleeding tendency.  Psychiatric/Behavioral: Negative for confusion.    PMFS History:  Patient Active Problem List   Diagnosis Date Noted  . Plantar fasciitis 09/29/2016  . Psoriasis 09/29/2016  . High risk medication use 09/29/2016  . Psoriatic arthritis ([place]) 09/14/2016  . FHx: hemochromatosis 09/14/2016  . Encounter for methotrexate monitoring 09/14/2016  . Family history of diabetes mellitus in mother 09/14/2016    Past Medical History:  Diagnosis Date  . Arthritis   . Psoriasis     Family History  Problem Relation Age of Onset  . Diabetes Mother   . Heart disease Father   . Irritable bowel syndrome Daughter    History reviewed. No pertinent surgical history. Social History   Social History Narrative  . Not on file   Immunization History  Administered Date(s) Administered  . HPV  9-valent 05/08/2019, 06/11/2019, 11/07/2019  . Hpv 05/08/2019, 06/11/2019, 11/07/2019  . Influenza Inj Mdck Quad Pf 03/19/2019  . Influenza,inj,Quad PF,6+ Mos 03/27/2017, 05/07/2018  . Influenza-Unspecified 04/10/2016  . Moderna SARS-COV2 Booster Vaccination 05/13/2020  . Moderna Sars-Covid-2 Vaccination 09/19/2019, 10/22/2019   . Tdap 06/21/2018     Objective: Vital Signs: BP 108/75 (BP Location: Right Arm, Patient Position: Sitting, Cuff Size: Normal)   Pulse 71   Resp 15   Ht 5' 7"  (1.702 m)   Wt 139 lb 12.8 oz (63.4 kg)   BMI 21.90 kg/m    Physical Exam Vitals and nursing note reviewed.  Constitutional:      Appearance: He is well-developed and well-nourished.  HENT:     Head: Normocephalic and atraumatic.  Eyes:     Extraocular Movements: EOM normal.     Conjunctiva/sclera: Conjunctivae normal.     Pupils: Pupils are equal, round, and reactive to light.  Pulmonary:     Effort: Pulmonary effort is normal.  Abdominal:     Palpations: Abdomen is soft.  Musculoskeletal:     Cervical back: Normal range of motion and neck supple.  Skin:    General: Skin is warm and dry.     Capillary Refill: Capillary refill takes less than 2 seconds.     Comments: Small patches of psoriasis on the extensor surface of the left elbow noted  Neurological:     Mental Status: He is alert and oriented to person, place, and time.  Psychiatric:        Mood and Affect: Mood and affect normal.        Behavior: Behavior normal.      Musculoskeletal Exam: C-spine, thoracic spine, lumbar spine good range of motion with no discomfort.  No midline spinal tenderness.  No SI joint tenderness.  Shoulder joints, elbow joints, wrist joints, MCPs, PIPs, DIPs have good range of motion with no synovitis.  He has complete fist formation bilaterally.  Hip joints have good range of motion with no discomfort.  Knee joints have good range of motion with no warmth or effusion.  Ankle joints have good range of motion with no tenderness or inflammation.  No tenderness along the Achilles tendons.  CDAI Exam: CDAI Score: - Patient Global: -; Provider Global: - Swollen: -; Tender: - Joint Exam 08/11/2020   No joint exam has been documented for this visit   There is currently no information documented on the homunculus. Go to the  Rheumatology activity and complete the homunculus joint exam.  Investigation: No additional findings.  Imaging: No results found.  Recent Labs: Lab Results  Component Value Date   WBC 6.1 05/27/2020   HGB 15.1 05/27/2020   PLT 249 05/27/2020   NA 137 05/27/2020   K 4.6 05/27/2020   CL 102 05/27/2020   CO2 28 05/27/2020   GLUCOSE 84 05/27/2020   BUN 13 05/27/2020   CREATININE 0.88 05/27/2020   BILITOT 0.6 05/27/2020   ALKPHOS 95 04/25/2019   AST 19 05/27/2020   ALT 17 05/27/2020   PROT 6.7 05/27/2020   ALBUMIN 4.9 04/25/2019   CALCIUM 9.3 05/27/2020   GFRAA 119 05/27/2020    Speciality Comments: No specialty comments available.  Procedures:  No procedures performed Allergies: Patient has no known allergies.   Assessment / Plan:     Visit Diagnoses: Psoriatic arthritis ([place]): He has no synovitis or dactylitis on exam.  He has clinically been doing well taking methotrexate 5 tablets by mouth once weekly.  He has not missed any doses of methotrexate recently.  He continues to have occasional discomfort due to [place] fasciitis but has been wearing over-the-counter orthotics on a daily basis which has been managing his symptoms.  His SI joint discomfort has improved significantly since starting to perform stretching exercises on a daily basis.  He has no SI joint tenderness on examination today.  He has not experiencing any other joint pain or inflammation at this time.  He has not developed any new patches of psoriasis.  He will continue taking methotrexate and folic acid as prescribed.  A refill of methotrexate was sent to the pharmacy.  He was advised to notify us if he develops increased joint pain or joint swelling.  He will follow-up in the office in 5 months.  Psoriasis: Small patches of psoriasis on the extensor surface of the left elbow joint were noted.  Scattered patches of psoriasis on his face and scalp which have been well managed with triamcinolone cream.  He  continues to follow-up with his dermatologist on a regular basis.  He will continue on methotrexate as prescribed.  He was advised to notify us if his psoriasis worsens.  High risk medication use - Methotrexate 2.5 mg 5 tablets every 7 days and folic acid 1 mg daily.  Discussed the importance of staying compliant with taking methotrexate and folic acid as prescribed.  CBC and CMP updated on 05/27/20.  Due to update lab work in February and every 3 months. Orders for CBC and CMP released.  His next lab work will be due in May and every 3 months. Standing orders for CBC and CMP are in place.   - Plan: CBC with Differential/Platelet, COMPLETE METABOLIC PANEL WITH GFR He was diagnosed with COVID-19 on 07/19/2020.  He did not receive the monoclonal antibody infusion.  His symptoms were mild and self resolving.  He had 3 moderna COVID-19 vaccine doses previously.   He has not had any other recent infections.  Discussed the importance of holding methotrexate if he develops signs or symptoms of an infection and to resume once the infection has completely cleared.  Plantar fasciitis - H/o: He was followed by a podiatrist closely for several years, and he has been wearing custom made orthotics for the past 10 years.  He recently switched to using OTC orthotics for support, and he has not noticed any new or worsening symptoms.    Chronic right-sided low back pain with right-sided sciatica: He has not been experiencing any increased discomfort in his lower back.  No symptoms of sciatica.  He has been performing back exercises daily which has alleviated his SI joint discomfort.  He has not SI joint tenderness on exam.   Other medical conditions are listed as follows:   FHx: hemochromatosis - Mother-Iron is checked yearly by PCP.   Family history of psoriasis  Orders: Orders Placed This Encounter  Procedures  . CBC with Differential/Platelet  . COMPLETE METABOLIC PANEL WITH GFR   Meds ordered this encounter   Medications  . methotrexate (RHEUMATREX) 2.5 MG tablet    Sig: Take 5 tablets by mouth once weekly. Caution:Chemotherapy. Protect from light.    Dispense:  60 tablet    Refill:  0     Follow-Up Instructions: Return in about 5 months (around 01/08/2021) for Psoriatic arthritis.   Ofilia Neas, PA-C  Note - This record has been created using Dragon software.  Chart creation errors have been sought, but may not always  have been located. Such creation errors do not reflect on  the standard of medical care.

## 2020-08-06 ENCOUNTER — Ambulatory Visit: Payer: BC Managed Care – PPO | Admitting: Physician Assistant

## 2020-08-06 DIAGNOSIS — Z840 Family history of diseases of the skin and subcutaneous tissue: Secondary | ICD-10-CM

## 2020-08-06 DIAGNOSIS — L409 Psoriasis, unspecified: Secondary | ICD-10-CM

## 2020-08-06 DIAGNOSIS — L4050 Arthropathic psoriasis, unspecified: Secondary | ICD-10-CM

## 2020-08-06 DIAGNOSIS — Z79899 Other long term (current) drug therapy: Secondary | ICD-10-CM

## 2020-08-06 DIAGNOSIS — G8929 Other chronic pain: Secondary | ICD-10-CM

## 2020-08-06 DIAGNOSIS — Z8349 Family history of other endocrine, nutritional and metabolic diseases: Secondary | ICD-10-CM

## 2020-08-06 DIAGNOSIS — M722 Plantar fascial fibromatosis: Secondary | ICD-10-CM

## 2020-08-11 ENCOUNTER — Encounter: Payer: Self-pay | Admitting: Physician Assistant

## 2020-08-11 ENCOUNTER — Ambulatory Visit: Payer: BC Managed Care – PPO | Admitting: Physician Assistant

## 2020-08-11 ENCOUNTER — Other Ambulatory Visit: Payer: Self-pay

## 2020-08-11 VITALS — BP 108/75 | HR 71 | Resp 15 | Ht 67.0 in | Wt 139.8 lb

## 2020-08-11 DIAGNOSIS — Z8349 Family history of other endocrine, nutritional and metabolic diseases: Secondary | ICD-10-CM

## 2020-08-11 DIAGNOSIS — L409 Psoriasis, unspecified: Secondary | ICD-10-CM

## 2020-08-11 DIAGNOSIS — Z79899 Other long term (current) drug therapy: Secondary | ICD-10-CM | POA: Diagnosis not present

## 2020-08-11 DIAGNOSIS — Z840 Family history of diseases of the skin and subcutaneous tissue: Secondary | ICD-10-CM

## 2020-08-11 DIAGNOSIS — G8929 Other chronic pain: Secondary | ICD-10-CM

## 2020-08-11 DIAGNOSIS — M722 Plantar fascial fibromatosis: Secondary | ICD-10-CM

## 2020-08-11 DIAGNOSIS — L4050 Arthropathic psoriasis, unspecified: Secondary | ICD-10-CM

## 2020-08-11 DIAGNOSIS — M5441 Lumbago with sciatica, right side: Secondary | ICD-10-CM

## 2020-08-11 LAB — CBC WITH DIFFERENTIAL/PLATELET
Absolute Monocytes: 403 cells/uL (ref 200–950)
Basophils Absolute: 51 cells/uL (ref 0–200)
Basophils Relative: 1 %
Eosinophils Absolute: 41 cells/uL (ref 15–500)
Eosinophils Relative: 0.8 %
HCT: 44.3 % (ref 38.5–50.0)
Hemoglobin: 15.5 g/dL (ref 13.2–17.1)
Lymphs Abs: 1234 cells/uL (ref 850–3900)
MCH: 32.6 pg (ref 27.0–33.0)
MCHC: 35 g/dL (ref 32.0–36.0)
MCV: 93.3 fL (ref 80.0–100.0)
MPV: 10.9 fL (ref 7.5–12.5)
Monocytes Relative: 7.9 %
Neutro Abs: 3371 cells/uL (ref 1500–7800)
Neutrophils Relative %: 66.1 %
Platelets: 243 10*3/uL (ref 140–400)
RBC: 4.75 10*6/uL (ref 4.20–5.80)
RDW: 12.8 % (ref 11.0–15.0)
Total Lymphocyte: 24.2 %
WBC: 5.1 10*3/uL (ref 3.8–10.8)

## 2020-08-11 LAB — COMPLETE METABOLIC PANEL WITH GFR
AG Ratio: 1.8 (calc) (ref 1.0–2.5)
ALT: 27 U/L (ref 9–46)
AST: 25 U/L (ref 10–40)
Albumin: 4.4 g/dL (ref 3.6–5.1)
Alkaline phosphatase (APISO): 82 U/L (ref 36–130)
BUN: 12 mg/dL (ref 7–25)
CO2: 29 mmol/L (ref 20–32)
Calcium: 9.3 mg/dL (ref 8.6–10.3)
Chloride: 105 mmol/L (ref 98–110)
Creat: 0.89 mg/dL (ref 0.60–1.35)
GFR, Est African American: 119 mL/min/{1.73_m2} (ref >=60)
GFR, Est Non African American: 103 mL/min/{1.73_m2} (ref >=60)
Globulin: 2.4 g/dL (calc) (ref 1.9–3.7)
Glucose, Bld: 75 mg/dL (ref 65–99)
Potassium: 4.5 mmol/L (ref 3.5–5.3)
Sodium: 140 mmol/L (ref 135–146)
Total Bilirubin: 0.7 mg/dL (ref 0.2–1.2)
Total Protein: 6.8 g/dL (ref 6.1–8.1)

## 2020-08-11 MED ORDER — METHOTREXATE 2.5 MG PO TABS
ORAL_TABLET | ORAL | 0 refills | Status: DC
Start: 2020-08-11 — End: 2020-11-03

## 2020-08-11 NOTE — Patient Instructions (Signed)
Standing Labs We placed an order today for your standing lab work.   Please have your standing labs drawn in May and every 3 months   If possible, please have your labs drawn 2 weeks prior to your appointment so that the provider can discuss your results at your appointment.  We have open lab daily Monday through Thursday from 8:30-12:30 PM and 1:30-4:30 PM and Friday from 8:30-12:30 PM and 1:30-4:00 PM at the office of Dr. Bo Merino, [facility].   Please be advised, all patients with office appointments requiring lab work will take precedents over walk-in lab work.  If possible, please come for your lab work on Monday and Friday afternoons, as you may experience shorter wait times. The office is located at [address] [place], [place],  [address] No appointment is necessary.   Labs are drawn by [facility]. Please bring your co-pay at the time of your lab draw.  You may receive a bill from [facility] for your lab work.  If you wish to have your labs drawn at another location, please call the office 24 hours in advance to send orders.  If you have any questions regarding directions or hours of operation,  please call [phone].   As a reminder, please drink plenty of water prior to coming for your lab work. Thanks!

## 2020-08-12 NOTE — Progress Notes (Signed)
CBC and CMP WNL

## 2020-10-02 ENCOUNTER — Telehealth: Payer: Self-pay | Admitting: Rheumatology

## 2020-10-02 NOTE — Telephone Encounter (Signed)
Patient requesting refill on Folic Acid sent to [facility] on [address].

## 2020-11-03 ENCOUNTER — Other Ambulatory Visit: Payer: Self-pay

## 2020-11-03 ENCOUNTER — Other Ambulatory Visit: Payer: Self-pay | Admitting: *Deleted

## 2020-11-03 MED ORDER — METHOTREXATE 2.5 MG PO TABS
ORAL_TABLET | ORAL | 0 refills | Status: DC
Start: 2020-11-03 — End: 2021-01-25

## 2020-11-03 MED ORDER — FOLIC ACID 1 MG PO TABS
1.0000 mg | ORAL_TABLET | Freq: Every day | ORAL | 3 refills | Status: DC
Start: 2020-11-03 — End: 2021-11-15

## 2020-11-03 NOTE — Telephone Encounter (Signed)
Next Visit: 01/21/2021  Last Visit: 08/11/2020  Last Fill: 10/03/2019  Dx:  Psoriatic arthritis   Current Dose per office note on 0/0/7622, folic acid 1 mg daily  Okay to refill folic acid?

## 2020-11-03 NOTE — Telephone Encounter (Signed)
Patient called checking the status of his prescription refill of Folic Acid that he requested 3 weeks ago to be sent to [facility] in [facility] on [address].  Patient states he drove to [facility] two separate times to check on refill.  Patient requested a return call.

## 2020-11-03 NOTE — Telephone Encounter (Signed)
Next Visit: 01/21/2021  Last Visit: 08/11/2020,   Last Fill: 08/11/2020  DX:  Psoriatic arthritis   Current Dose per office note 08/11/2020, Methotrexate 2.5 mg 5 tablets every 7 days   Labs: 08/11/2020, CBC and CMP WNL  Okay to refill MTX?

## 2020-11-17 ENCOUNTER — Other Ambulatory Visit: Payer: Self-pay

## 2020-11-17 DIAGNOSIS — Z79899 Other long term (current) drug therapy: Secondary | ICD-10-CM

## 2020-11-18 LAB — CBC WITH DIFFERENTIAL/PLATELET
Absolute Monocytes: 350 cells/uL (ref 200–950)
Basophils Absolute: 20 cells/uL (ref 0–200)
Basophils Relative: 0.4 %
Eosinophils Absolute: 60 cells/uL (ref 15–500)
Eosinophils Relative: 1.2 %
HCT: 44.6 % (ref 38.5–50.0)
Hemoglobin: 15.4 g/dL (ref 13.2–17.1)
Lymphs Abs: 1395 cells/uL (ref 850–3900)
MCH: 32 pg (ref 27.0–33.0)
MCHC: 34.5 g/dL (ref 32.0–36.0)
MCV: 92.7 fL (ref 80.0–100.0)
MPV: 10.9 fL (ref 7.5–12.5)
Monocytes Relative: 7 %
Neutro Abs: 3175 cells/uL (ref 1500–7800)
Neutrophils Relative %: 63.5 %
Platelets: 231 10*3/uL (ref 140–400)
RBC: 4.81 10*6/uL (ref 4.20–5.80)
RDW: 12.9 % (ref 11.0–15.0)
Total Lymphocyte: 27.9 %
WBC: 5 10*3/uL (ref 3.8–10.8)

## 2020-11-18 LAB — COMPLETE METABOLIC PANEL WITH GFR
AG Ratio: 2.1 (calc) (ref 1.0–2.5)
ALT: 26 U/L (ref 9–46)
AST: 22 U/L (ref 10–40)
Albumin: 4.5 g/dL (ref 3.6–5.1)
Alkaline phosphatase (APISO): 68 U/L (ref 36–130)
BUN: 11 mg/dL (ref 7–25)
CO2: 28 mmol/L (ref 20–32)
Calcium: 9.4 mg/dL (ref 8.6–10.3)
Chloride: 103 mmol/L (ref 98–110)
Creat: 0.89 mg/dL (ref 0.60–1.35)
GFR, Est African American: 119 mL/min/{1.73_m2} (ref >=60)
GFR, Est Non African American: 103 mL/min/{1.73_m2} (ref >=60)
Globulin: 2.1 g/dL (calc) (ref 1.9–3.7)
Glucose, Bld: 77 mg/dL (ref 65–99)
Potassium: 4.4 mmol/L (ref 3.5–5.3)
Sodium: 138 mmol/L (ref 135–146)
Total Bilirubin: 0.7 mg/dL (ref 0.2–1.2)
Total Protein: 6.6 g/dL (ref 6.1–8.1)

## 2020-11-18 NOTE — Progress Notes (Signed)
CBC and CMP WNL

## 2021-01-08 NOTE — Progress Notes (Signed)
Office Visit Note  Patient: Rick Santiago             Date of Birth: 1976-04-09           MRN: [id number]             PCP: [name], MD Referring: [name], MD Visit Date: 01/21/2021 Occupation: @GUAROCC @  Subjective:  Medication management.   History of Present Illness: Rick Santiago is a 45 y.o. male with a history of psoriatic arthritis and psoriasis.  He states he recently went to the beach and his psoriasis is improved.  He continues to have psoriasis patches on his scalp, left elbow, back and umbilical area.  He denies any joint swelling or joint pain.  He states he takes methotrexate on a weekly basis.  He forgets to take folic acid.  Activities of Daily Living:  Patient reports morning stiffness for 0 minutes.   Patient Denies nocturnal pain.  Difficulty dressing/grooming: Denies Difficulty climbing stairs: Denies Difficulty getting out of chair: Denies Difficulty using hands for taps, buttons, cutlery, and/or writing: Denies  Review of Systems  Constitutional:  Negative for fatigue.  HENT:  Negative for mouth sores, mouth dryness and nose dryness.   Eyes:  Negative for pain, itching and dryness.  Respiratory:  Negative for shortness of breath and difficulty breathing.   Cardiovascular:  Negative for chest pain and palpitations.  Gastrointestinal:  Negative for blood in stool, constipation and diarrhea.  Endocrine: Negative for increased urination.  Genitourinary:  Negative for difficulty urinating.  Musculoskeletal:  Negative for joint pain, joint pain, joint swelling, myalgias, morning stiffness, muscle tenderness and myalgias.  Skin:  Negative for color change, rash and redness.  Allergic/Immunologic: Negative for susceptible to infections.  Neurological:  Negative for dizziness, numbness, headaches, memory loss and weakness.  Hematological:  Negative for bruising/bleeding tendency.  Psychiatric/Behavioral:  Negative for confusion.    PMFS  History:  Patient Active Problem List   Diagnosis Date Noted   Plantar fasciitis 09/29/2016   Psoriasis 09/29/2016   High risk medication use 09/29/2016   Psoriatic arthritis ([place]) 09/14/2016   FHx: hemochromatosis 09/14/2016   Encounter for methotrexate monitoring 09/14/2016   Family history of diabetes mellitus in mother 09/14/2016    Past Medical History:  Diagnosis Date   Arthritis    Psoriasis     Family History  Problem Relation Age of Onset   Diabetes Mother    Heart disease Father    Irritable bowel syndrome Daughter    History reviewed. No pertinent surgical history. Social History   Social History Narrative   Not on file   Immunization History  Administered Date(s) Administered   HPV 9-valent 05/08/2019, 06/11/2019, 11/07/2019   Hpv-Unspecified 05/08/2019, 06/11/2019, 11/07/2019   Influenza Inj Mdck Quad Pf 03/19/2019   Influenza,inj,Quad PF,6+ Mos 03/27/2017, 05/07/2018   Influenza-Unspecified 04/10/2016   Moderna SARS-COV2 Booster Vaccination 05/13/2020   Moderna Sars-Covid-2 Vaccination 09/19/2019, 10/22/2019   Tdap 06/21/2018     Objective: Vital Signs: BP 129/75 (BP Location: Left Arm, Patient Position: Sitting, Cuff Size: Normal)   Pulse 66   Resp 16   Ht 5' 7"  (1.702 m)   Wt 145 lb (65.8 kg)   BMI 22.71 kg/m    Physical Exam Vitals and nursing note reviewed.  Constitutional:      Appearance: He is well-developed.  HENT:     Head: Normocephalic and atraumatic.  Eyes:     Conjunctiva/sclera: Conjunctivae normal.     Pupils:  Pupils are equal, round, and reactive to light.  Cardiovascular:     Rate and Rhythm: Normal rate and regular rhythm.     Heart sounds: Normal heart sounds.  Pulmonary:     Effort: Pulmonary effort is normal.     Breath sounds: Normal breath sounds.  Abdominal:     General: Bowel sounds are normal.     Palpations: Abdomen is soft.  Musculoskeletal:     Cervical back: Normal range of motion and neck supple.   Skin:    General: Skin is warm and dry.     Capillary Refill: Capillary refill takes less than 2 seconds.     Comments: Psoriasis patches were noted on the scalp, nape of the neck, umbilicus, left elbow.  Neurological:     Mental Status: He is alert and oriented to person, place, and time.  Psychiatric:        Behavior: Behavior normal.     Musculoskeletal Exam: C-spine, thoracic and lumbar spine with good range of motion.  Shoulder joints, elbow joints, wrist joints, MCPs PIPs and DIPs with good range of motion with no synovitis.  Hip joints, knee joints, ankles, MTPs and PIPs with good range of motion with no synovitis  CDAI Exam: CDAI Score: -- Patient Global: --; Provider Global: -- Swollen: --; Tender: -- Joint Exam 01/21/2021   No joint exam has been documented for this visit   There is currently no information documented on the homunculus. Go to the Rheumatology activity and complete the homunculus joint exam.  Investigation: No additional findings.  Imaging: No results found.  Recent Labs: Lab Results  Component Value Date   WBC 5.0 11/17/2020   HGB 15.4 11/17/2020   PLT 231 11/17/2020   NA 138 11/17/2020   K 4.4 11/17/2020   CL 103 11/17/2020   CO2 28 11/17/2020   GLUCOSE 77 11/17/2020   BUN 11 11/17/2020   CREATININE 0.89 11/17/2020   BILITOT 0.7 11/17/2020   ALKPHOS 95 04/25/2019   AST 22 11/17/2020   ALT 26 11/17/2020   PROT 6.6 11/17/2020   ALBUMIN 4.9 04/25/2019   CALCIUM 9.4 11/17/2020   GFRAA 119 11/17/2020    Speciality Comments: No specialty comments available.  Procedures:  No procedures performed Allergies: Patient has no known allergies.   Assessment / Plan:     Visit Diagnoses: Psoriatic arthritis (HCC)-he had no active synovitis on my examination.  There is no history of iritis, plantar fasciitis or Achilles tendinitis.  Psoriasis-he continues to have psoriasis lesions on his scalp, left elbow, back and umbilical area.  I had a  detailed discussion with patient regarding adding Otezla.  Side effects from [place] were discussed at length.  I also discussed the use of other Biologics for the treatment of psoriasis.  He is concerned that he may not be compliant with [place] as he does not even like taking folic acid on a regular basis.  He takes methotrexate once a week.  Use of topical agents for psoriasis were discussed.  He does not use topical agents for psoriasis.  He has a prescription from his dermatologist.  High risk medication use - Methotrexate 2.5 mg 5 tablets every 7 days and folic acid 1 mg daily.  Labs from Nov 17, 2020 were within normal limits which are reviewed.  He has been advised to get labs in August and every 3 months to monitor for drug toxicity.  He has been advised to stop methotrexate in case he develops an  infection.  Information regarding COVID-19 booster was given in information was placed in the AVS.  Information regarding other vaccines were also placed in the AVS.  Plantar fasciitis - H/o:followed by a podiatrist closely for several years, and he has been wearing custom made orthotics for the past 10 years. switched to using OTC orthotics.  He denies any [place] fasciitis recently.  Chronic right-sided low back pain with right-sided sciatica-he has discomfort off and on.  He denies any discomfort today.  FHx: hemochromatosis - Mother-Iron is checked yearly by PCP.   Family history of psoriasis  Increased risk of heart disease with psoriatic arthritis was discussed.  Need for regular exercise and dietary modifications were discussed.  Orders: No orders of the defined types were placed in this encounter.  No orders of the defined types were placed in this encounter.    Follow-Up Instructions: Return in about 5 months (around 06/23/2021) for Psoriatic arthritis.   Bo Merino, MD  Note - This record has been created using Editor, commissioning.  Chart creation errors have been sought, but  may not always  have been located. Such creation errors do not reflect on  the standard of medical care.

## 2021-01-21 ENCOUNTER — Ambulatory Visit: Payer: BC Managed Care – PPO | Admitting: Rheumatology

## 2021-01-21 ENCOUNTER — Other Ambulatory Visit: Payer: Self-pay

## 2021-01-21 ENCOUNTER — Encounter: Payer: Self-pay | Admitting: Rheumatology

## 2021-01-21 VITALS — BP 129/75 | HR 66 | Resp 16 | Ht 67.0 in | Wt 145.0 lb

## 2021-01-21 DIAGNOSIS — L409 Psoriasis, unspecified: Secondary | ICD-10-CM | POA: Diagnosis not present

## 2021-01-21 DIAGNOSIS — L4050 Arthropathic psoriasis, unspecified: Secondary | ICD-10-CM | POA: Diagnosis not present

## 2021-01-21 DIAGNOSIS — M722 Plantar fascial fibromatosis: Secondary | ICD-10-CM

## 2021-01-21 DIAGNOSIS — G8929 Other chronic pain: Secondary | ICD-10-CM

## 2021-01-21 DIAGNOSIS — Z8349 Family history of other endocrine, nutritional and metabolic diseases: Secondary | ICD-10-CM

## 2021-01-21 DIAGNOSIS — Z840 Family history of diseases of the skin and subcutaneous tissue: Secondary | ICD-10-CM

## 2021-01-21 DIAGNOSIS — M5441 Lumbago with sciatica, right side: Secondary | ICD-10-CM

## 2021-01-21 DIAGNOSIS — Z79899 Other long term (current) drug therapy: Secondary | ICD-10-CM

## 2021-01-21 NOTE — Patient Instructions (Signed)
Standing Labs We placed an order today for your standing lab work.   Please have your standing labs drawn in August and every 3 months  If possible, please have your labs drawn 2 weeks prior to your appointment so that the provider can discuss your results at your appointment.  Please note that you may see your imaging and lab results in [place] before we have reviewed them. We may be awaiting multiple results to interpret others before contacting you. Please allow our office up to 72 hours to thoroughly review all of the results before contacting the office for clarification of your results.  We have open lab daily: Monday through Thursday from 1:30-4:30 PM and Friday from 1:30-4:00 PM at the office of Dr. Bo Merino, [facility].   Please be advised, all patients with office appointments requiring lab work will take precedent over walk-in lab work.  If possible, please come for your lab work on Monday and Friday afternoons, as you may experience shorter wait times. The office is located at [address] [place], [place], [place] [address] No appointment is necessary.   Labs are drawn by [facility]. Please bring your co-pay at the time of your lab draw.  You may receive a bill from [facility] for your lab work.  If you wish to have your labs drawn at another location, please call the office 24 hours in advance to send orders.  If you have any questions regarding directions or hours of operation,  please call [phone].   As a reminder, please drink plenty of water prior to coming for your lab work. Thanks!  COVID-19 vaccine recommendations:   COVID-19 vaccine is recommended for everyone (unless you are allergic to a vaccine component), even if you are on a medication that suppresses your immune system.   If you are on Methotrexate, Cellcept (mycophenolate), Rinvoq, Morrie Sheldon, and Olumiant- hold the medication for 1 week after each vaccine. Hold Methotrexate for 2 weeks  after the single dose COVID-19 vaccine.   If you are on Orencia subcutaneous injection - hold medication one week prior to and one week after the first COVID-19 vaccine dose (only).   If you are on Orencia IV infusions- time vaccination administration so that the first COVID-19 vaccination will occur four weeks after the infusion and postpone the subsequent infusion by one week.   If you are on Cyclophosphamide or Rituxan infusions please contact your doctor prior to receiving the COVID-19 vaccine.   Do not take Tylenol or any anti-inflammatory medications (NSAIDs) 24 hours prior to the COVID-19 vaccination.   There is no direct evidence about the efficacy of the COVID-19 vaccine in individuals who are on medications that suppress the immune system.   Even if you are fully vaccinated, and you are on any medications that suppress your immune system, please continue to wear a mask, maintain at least six feet social distance and practice hand hygiene.   If you develop a COVID-19 infection, please contact your PCP or our office to determine if you need monoclonal antibody infusion.  The booster vaccine is now available for immunocompromised patients.   Please see the following web sites for updated information.   [email or web address]  If you have signs or symptoms of an infection or start antibiotics: First, call your PCP for workup of your infection. Hold your medication through the infection, until you complete your antibiotics, and until symptoms resolve if you take the following: Injectable medication (Actemra, Benlysta, Cimzia, Cosentyx, Enbrel, Humira, Kevzara,  Orencia, Remicade, Simponi, Stelara, Taltz, Tremfya) Methotrexate Leflunomide (Arava) Mycophenolate (Cellcept) Roma Kayser, or Rinvoq  If you test POSITIVE for COVID19 and have MILD to MODERATE symptoms: First, call your PCP if you would like to receive  COVID19 treatment AND Hold your medications during the infection and for at least 1 week after your symptoms have resolved: Injectable medication (Benlysta, Cimzia, Cosentyx, Enbrel, Humira, Orencia, Remicade, Simponi, Stelara, Taltz, Tremfya) Methotrexate Leflunomide (Arava) Mycophenolate (Cellcept) Morrie Sheldon, Olumiant, or Rinvoq If you take Actemra or Kevzara, you DO NOT need to hold these for COVID19 infection.  If you test POSITIVE for COVID19 and have NO symptoms: First, call your PCP if you would like to receive COVID19 treatment AND Hold your medications for at least 10 days after the day that you tested positive Injectable medication (Benlysta, Cimzia, Cosentyx, Enbrel, Humira, Orencia, Remicade, Simponi, Stelara, Taltz, Tremfya) Methotrexate Leflunomide (Arava) Mycophenolate (Cellcept) Morrie Sheldon, Olumiant, or Rinvoq If you take Actemra or Kevzara, you DO NOT need to hold these for COVID19 infection.  Vaccines You are taking a medication(s) that can suppress your immune system.  The following immunizations are recommended: Flu annually Covid-19  Td/Tdap (tetanus, diphtheria, pertussis) every 10 years Pneumonia (Prevnar 15 then Pneumovax 23 at least 1 year apart.  Alternatively, can take Prevnar 20 without needing additional dose) Shingrix (after age 38): 2 doses from 4 weeks to 6 months apart  Please check with your PCP to make sure you are up to date.   Heart Disease Prevention   Your inflammatory disease increases your risk of heart disease which includes heart attack, stroke, atrial fibrillation (irregular heartbeats), high blood pressure, heart failure and atherosclerosis (plaque in the arteries).  It is important to reduce your risk by:   Keep blood pressure, cholesterol, and blood sugar at healthy levels   Smoking Cessation   Maintain a healthy weight  BMI 20-25   Eat a healthy diet  Plenty of fresh fruit, vegetables, and whole grains  Limit saturated fats, foods  high in sodium, and added sugars  DASH and Mediterranean diet   Increase physical activity  Recommend moderate physically activity for 150 minutes per week/ 30 minutes a day for five days a week These can be broken up into three separate ten-minute sessions during the day.   Reduce Stress  Meditation, slow breathing exercises, yoga, coloring books  Dental visits twice a year

## 2021-01-25 ENCOUNTER — Other Ambulatory Visit: Payer: Self-pay | Admitting: Physician Assistant

## 2021-01-25 NOTE — Telephone Encounter (Signed)
Next Visit: 06/22/2021  Last Visit: 01/21/2021  Last Fill: 11/03/2020  DX:  Psoriatic arthritis  Current Dose per office note 01/21/2021: Methotrexate 2.5 mg 5 tablets every 7 days  Labs: 11/17/2020 CBC and CMP WNL  Per protocol, okay to refill per Dr. Estanislado Pandy

## 2021-03-02 ENCOUNTER — Other Ambulatory Visit: Payer: Self-pay | Admitting: *Deleted

## 2021-03-02 DIAGNOSIS — Z79899 Other long term (current) drug therapy: Secondary | ICD-10-CM

## 2021-03-02 LAB — COMPLETE METABOLIC PANEL WITH GFR
AG Ratio: 2.4 (calc) (ref 1.0–2.5)
ALT: 18 U/L (ref 9–46)
AST: 18 U/L (ref 10–40)
Albumin: 4.8 g/dL (ref 3.6–5.1)
Alkaline phosphatase (APISO): 66 U/L (ref 36–130)
BUN: 12 mg/dL (ref 7–25)
CO2: 29 mmol/L (ref 20–32)
Calcium: 9.4 mg/dL (ref 8.6–10.3)
Chloride: 102 mmol/L (ref 98–110)
Creat: 0.85 mg/dL (ref 0.60–1.29)
Globulin: 2 g/dL (calc) (ref 1.9–3.7)
Glucose, Bld: 78 mg/dL (ref 65–99)
Potassium: 4.3 mmol/L (ref 3.5–5.3)
Sodium: 137 mmol/L (ref 135–146)
Total Bilirubin: 0.9 mg/dL (ref 0.2–1.2)
Total Protein: 6.8 g/dL (ref 6.1–8.1)
eGFR: 109 mL/min/{1.73_m2} (ref >=60)

## 2021-03-02 LAB — CBC WITH DIFFERENTIAL/PLATELET
Absolute Monocytes: 407 cells/uL (ref 200–950)
Basophils Absolute: 61 cells/uL (ref 0–200)
Basophils Relative: 1.1 %
Eosinophils Absolute: 39 cells/uL (ref 15–500)
Eosinophils Relative: 0.7 %
HCT: 44 % (ref 38.5–50.0)
Hemoglobin: 15.3 g/dL (ref 13.2–17.1)
Lymphs Abs: 1584 cells/uL (ref 850–3900)
MCH: 32 pg (ref 27.0–33.0)
MCHC: 34.8 g/dL (ref 32.0–36.0)
MCV: 92.1 fL (ref 80.0–100.0)
MPV: 10.2 fL (ref 7.5–12.5)
Monocytes Relative: 7.4 %
Neutro Abs: 3410 cells/uL (ref 1500–7800)
Neutrophils Relative %: 62 %
Platelets: 242 10*3/uL (ref 140–400)
RBC: 4.78 10*6/uL (ref 4.20–5.80)
RDW: 13 % (ref 11.0–15.0)
Total Lymphocyte: 28.8 %
WBC: 5.5 10*3/uL (ref 3.8–10.8)

## 2021-03-03 NOTE — Progress Notes (Signed)
CBC and CMP WNL

## 2021-04-04 ENCOUNTER — Other Ambulatory Visit: Payer: Self-pay

## 2021-04-04 ENCOUNTER — Encounter (HOSPITAL_COMMUNITY): Payer: Self-pay

## 2021-04-04 ENCOUNTER — Inpatient Hospital Stay (HOSPITAL_COMMUNITY)
Admission: EM | Admit: 2021-04-04 | Discharge: 2021-04-07 | DRG: 641 | Disposition: A | Discharge status: Home / Self Care | Payer: BC Managed Care – PPO | Attending: Internal Medicine | Admitting: Internal Medicine

## 2021-04-04 DIAGNOSIS — Z79899 Other long term (current) drug therapy: Secondary | ICD-10-CM

## 2021-04-04 DIAGNOSIS — E861 Hypovolemia: Secondary | ICD-10-CM | POA: Diagnosis present

## 2021-04-04 DIAGNOSIS — Z20822 Contact with and (suspected) exposure to covid-19: Secondary | ICD-10-CM | POA: Diagnosis present

## 2021-04-04 DIAGNOSIS — E871 Hypo-osmolality and hyponatremia: Principal | ICD-10-CM | POA: Diagnosis present

## 2021-04-04 DIAGNOSIS — M1990 Unspecified osteoarthritis, unspecified site: Secondary | ICD-10-CM | POA: Diagnosis present

## 2021-04-04 DIAGNOSIS — R531 Weakness: Secondary | ICD-10-CM | POA: Diagnosis not present

## 2021-04-04 DIAGNOSIS — E876 Hypokalemia: Secondary | ICD-10-CM | POA: Diagnosis present

## 2021-04-04 DIAGNOSIS — M069 Rheumatoid arthritis, unspecified: Secondary | ICD-10-CM | POA: Diagnosis present

## 2021-04-04 DIAGNOSIS — L4050 Arthropathic psoriasis, unspecified: Secondary | ICD-10-CM | POA: Diagnosis present

## 2021-04-04 NOTE — ED Triage Notes (Signed)
Pt complains of arm weakness and headache since taking half of his colonoscopy prep at 5 pm.

## 2021-04-05 ENCOUNTER — Encounter (HOSPITAL_COMMUNITY): Payer: Self-pay | Admitting: Family Medicine

## 2021-04-05 DIAGNOSIS — M1990 Unspecified osteoarthritis, unspecified site: Secondary | ICD-10-CM | POA: Diagnosis present

## 2021-04-05 DIAGNOSIS — E871 Hypo-osmolality and hyponatremia: Secondary | ICD-10-CM | POA: Diagnosis present

## 2021-04-05 DIAGNOSIS — R531 Weakness: Secondary | ICD-10-CM | POA: Diagnosis present

## 2021-04-05 DIAGNOSIS — Z79899 Other long term (current) drug therapy: Secondary | ICD-10-CM | POA: Diagnosis not present

## 2021-04-05 DIAGNOSIS — E861 Hypovolemia: Secondary | ICD-10-CM | POA: Diagnosis present

## 2021-04-05 DIAGNOSIS — E876 Hypokalemia: Secondary | ICD-10-CM

## 2021-04-05 DIAGNOSIS — L4050 Arthropathic psoriasis, unspecified: Secondary | ICD-10-CM | POA: Diagnosis present

## 2021-04-05 DIAGNOSIS — Z20822 Contact with and (suspected) exposure to covid-19: Secondary | ICD-10-CM | POA: Diagnosis present

## 2021-04-05 DIAGNOSIS — M069 Rheumatoid arthritis, unspecified: Secondary | ICD-10-CM | POA: Diagnosis present

## 2021-04-05 LAB — URINALYSIS, ROUTINE W REFLEX MICROSCOPIC
Bilirubin Urine: NEGATIVE
Glucose, UA: 250 mg/dL — AB
Hgb urine dipstick: NEGATIVE
Ketones, ur: 15 mg/dL — AB
Leukocytes,Ua: NEGATIVE
Nitrite: NEGATIVE
Protein, ur: NEGATIVE mg/dL
Specific Gravity, Urine: 1.01 (ref 1.005–1.030)
pH: 7 (ref 5.0–8.0)

## 2021-04-05 LAB — CBC
HCT: 37.8 % — ABNORMAL LOW (ref 39.0–52.0)
Hemoglobin: 13.8 g/dL (ref 13.0–17.0)
MCH: 32.2 pg (ref 26.0–34.0)
MCHC: 36.5 g/dL — ABNORMAL HIGH (ref 30.0–36.0)
MCV: 88.1 fL (ref 80.0–100.0)
Platelets: 213 10*3/uL (ref 150–400)
RBC: 4.29 MIL/uL (ref 4.22–5.81)
RDW: 12 % (ref 11.5–15.5)
WBC: 12.1 10*3/uL — ABNORMAL HIGH (ref 4.0–10.5)
nRBC: 0 % (ref 0.0–0.2)

## 2021-04-05 LAB — CBC WITH DIFFERENTIAL/PLATELET
Band Neutrophils: 1 %
Basophils Relative: 0 %
Blasts: NONE SEEN %
Eosinophils Relative: 0 %
HCT: 38 % — ABNORMAL LOW (ref 39.0–52.0)
Hemoglobin: 13.7 g/dL (ref 13.0–17.0)
Lymphocytes Relative: 26 %
MCH: 32.3 pg (ref 26.0–34.0)
MCHC: 36.1 g/dL — ABNORMAL HIGH (ref 30.0–36.0)
MCV: 89.6 fL (ref 80.0–100.0)
Metamyelocytes Relative: NONE SEEN %
Monocytes Relative: 5 %
Myelocytes: NONE SEEN %
Neutrophils Relative %: 68 %
Platelets: 219 10*3/uL (ref 150–400)
Promyelocytes Relative: NONE SEEN %
RBC Morphology: NORMAL
RBC: 4.24 MIL/uL (ref 4.22–5.81)
RDW: 12.3 % (ref 11.5–15.5)
WBC Morphology: NORMAL
WBC: 8.8 10*3/uL (ref 4.0–10.5)
nRBC: 0 % (ref 0.0–0.2)
nRBC: 3 /100 WBC — ABNORMAL HIGH

## 2021-04-05 LAB — SARS CORONAVIRUS 2 (TAT 6-24 HRS): SARS Coronavirus 2: NEGATIVE

## 2021-04-05 LAB — BASIC METABOLIC PANEL
Anion gap: 11 (ref 5–15)
BUN: 5 mg/dL — ABNORMAL LOW (ref 6–20)
CO2: 22 mmol/L (ref 22–32)
Calcium: 7.8 mg/dL — ABNORMAL LOW (ref 8.9–10.3)
Chloride: 87 mmol/L — ABNORMAL LOW (ref 98–111)
Creatinine, Ser: 0.7 mg/dL (ref 0.61–1.24)
GFR, Estimated: >60 mL/min (ref >60)
Glucose, Bld: 144 mg/dL — ABNORMAL HIGH (ref 70–99)
Potassium: 3.8 mmol/L (ref 3.5–5.1)
Sodium: 120 mmol/L — ABNORMAL LOW (ref 135–145)

## 2021-04-05 LAB — MRSA NEXT GEN BY PCR, NASAL: MRSA by PCR Next Gen: NOT DETECTED

## 2021-04-05 LAB — COMPREHENSIVE METABOLIC PANEL
ALT: 20 U/L (ref 0–44)
AST: 31 U/L (ref 15–41)
Albumin: 4 g/dL (ref 3.5–5.0)
Alkaline Phosphatase: 57 U/L (ref 38–126)
Anion gap: 10 (ref 5–15)
BUN: 7 mg/dL (ref 6–20)
CO2: 23 mmol/L (ref 22–32)
Calcium: 8 mg/dL — ABNORMAL LOW (ref 8.9–10.3)
Chloride: 87 mmol/L — ABNORMAL LOW (ref 98–111)
Creatinine, Ser: 0.88 mg/dL (ref 0.61–1.24)
GFR, Estimated: >60 mL/min (ref >60)
Glucose, Bld: 198 mg/dL — ABNORMAL HIGH (ref 70–99)
Potassium: 2.9 mmol/L — ABNORMAL LOW (ref 3.5–5.1)
Sodium: 120 mmol/L — ABNORMAL LOW (ref 135–145)
Total Bilirubin: 1 mg/dL (ref 0.3–1.2)
Total Protein: 6.4 g/dL — ABNORMAL LOW (ref 6.5–8.1)

## 2021-04-05 LAB — CBG MONITORING, ED: Glucose-Capillary: 218 mg/dL — ABNORMAL HIGH (ref 70–99)

## 2021-04-05 LAB — SODIUM
Sodium: 118 mmol/L — CL (ref 135–145)
Sodium: 118 mmol/L — CL (ref 135–145)
Sodium: 119 mmol/L — CL (ref 135–145)
Sodium: 128 mmol/L — ABNORMAL LOW (ref 135–145)
Sodium: 125 mmol/L — ABNORMAL LOW (ref 135–145)
Sodium: 135 mmol/L (ref 135–145)
Sodium: 133 mmol/L — ABNORMAL LOW (ref 135–145)

## 2021-04-05 LAB — MAGNESIUM
Magnesium: 1.7 mg/dL (ref 1.7–2.4)
Magnesium: 1.3 mg/dL — ABNORMAL LOW (ref 1.7–2.4)

## 2021-04-05 LAB — CORTISOL: Cortisol, Plasma: 31.4 ug/dL

## 2021-04-05 LAB — OSMOLALITY, URINE: Osmolality, Ur: 351 mOsm/kg (ref 300–900)

## 2021-04-05 LAB — HIV ANTIBODY (ROUTINE TESTING W REFLEX): HIV Screen 4th Generation wRfx: NONREACTIVE

## 2021-04-05 LAB — OSMOLALITY: Osmolality: 240 mOsm/kg — CL (ref 275–295)

## 2021-04-05 LAB — TSH: TSH: 0.884 u[IU]/mL (ref 0.350–4.500)

## 2021-04-05 LAB — SODIUM, URINE, RANDOM: Sodium, Ur: 108 mmol/L

## 2021-04-05 LAB — CK: Total CK: 139 U/L (ref 49–397)

## 2021-04-05 MED ORDER — SODIUM CHLORIDE 3 % IV BOLUS
100.0000 mL | Freq: Once | INTRAVENOUS | Status: AC
  Administered 2021-04-05: 100 mL via INTRAVENOUS
  Filled 2021-04-05: qty 500

## 2021-04-05 MED ORDER — POTASSIUM CHLORIDE 10 MEQ/100ML IV SOLN
10.0000 meq | INTRAVENOUS | Status: AC
  Administered 2021-04-05 (×2): 10 meq via INTRAVENOUS
  Filled 2021-04-05: qty 100

## 2021-04-05 MED ORDER — CHLORHEXIDINE GLUCONATE CLOTH 2 % EX PADS
6.0000 | MEDICATED_PAD | Freq: Every day | CUTANEOUS | Status: DC
  Administered 2021-04-05 – 2021-04-07 (×3): 6 via TOPICAL

## 2021-04-05 MED ORDER — ONDANSETRON HCL 4 MG/2ML IJ SOLN
4.0000 mg | Freq: Four times a day (QID) | INTRAMUSCULAR | Status: DC | PRN
  Administered 2021-04-05: 4 mg via INTRAVENOUS
  Filled 2021-04-05: qty 2

## 2021-04-05 MED ORDER — ACETAMINOPHEN 650 MG RE SUPP: 650.0000 mg | Freq: Four times a day (QID) | RECTAL | Status: DC | PRN

## 2021-04-05 MED ORDER — SODIUM CHLORIDE 0.9 % IV SOLN: INTRAVENOUS | Status: DC

## 2021-04-05 MED ORDER — ACETAMINOPHEN 325 MG PO TABS: 650.0000 mg | ORAL_TABLET | Freq: Four times a day (QID) | ORAL | Status: DC | PRN

## 2021-04-05 MED ORDER — DEXTROSE 5 % IV SOLN: INTRAVENOUS | Status: DC

## 2021-04-05 MED ORDER — SODIUM CHLORIDE 0.9 % IV BOLUS: 1000.0000 mL | Freq: Once | INTRAVENOUS | Status: DC

## 2021-04-05 MED ORDER — ONDANSETRON HCL 4 MG/2ML IJ SOLN: 4.0000 mg | Freq: Once | INTRAMUSCULAR | Status: AC

## 2021-04-05 MED ORDER — MAGNESIUM SULFATE 50 % IJ SOLN: 3.0000 g | Freq: Once | INTRAVENOUS | Status: DC

## 2021-04-05 MED ORDER — LORAZEPAM 2 MG/ML IJ SOLN
1.0000 mg | Freq: Once | INTRAMUSCULAR | Status: AC
  Administered 2021-04-05: 1 mg via INTRAVENOUS
  Filled 2021-04-05: qty 1

## 2021-04-05 MED ORDER — SODIUM CHLORIDE 0.9 % IV SOLN: Freq: Once | INTRAVENOUS | Status: AC

## 2021-04-05 MED ORDER — SODIUM CHLORIDE 0.9 % IV BOLUS
1000.0000 mL | Freq: Once | INTRAVENOUS | Status: AC
  Administered 2021-04-05: 1000 mL via INTRAVENOUS

## 2021-04-05 MED ORDER — ONDANSETRON HCL 4 MG/2ML IJ SOLN
INTRAMUSCULAR | Status: AC
  Administered 2021-04-05: 4 mg via INTRAVENOUS
  Filled 2021-04-05: qty 2

## 2021-04-05 MED ORDER — POTASSIUM CHLORIDE 10 MEQ/100ML IV SOLN
10.0000 meq | INTRAVENOUS | Status: AC
Start: 2021-04-05 — End: 2021-04-05
  Administered 2021-04-05 (×2): 10 meq via INTRAVENOUS
  Filled 2021-04-05 (×2): qty 100

## 2021-04-05 MED ORDER — ONDANSETRON HCL 4 MG PO TABS: 4.0000 mg | ORAL_TABLET | Freq: Four times a day (QID) | ORAL | Status: DC | PRN

## 2021-04-05 MED ORDER — MAGNESIUM SULFATE 4 GM/100ML IV SOLN
4.0000 g | Freq: Once | INTRAVENOUS | Status: AC
  Administered 2021-04-05: 4 g via INTRAVENOUS
  Filled 2021-04-05: qty 100

## 2021-04-05 MED ORDER — SODIUM CHLORIDE 3 % IV SOLN
INTRAVENOUS | Status: DC
  Filled 2021-04-05: qty 500

## 2021-04-05 MED ORDER — SODIUM CHLORIDE 0.9 % IV SOLN
INTRAVENOUS | Status: DC | PRN
  Administered 2021-04-05: 1000 mL via INTRAVENOUS

## 2021-04-05 MED ORDER — DESMOPRESSIN ACETATE 4 MCG/ML IJ SOLN
1.0000 ug | Freq: Once | INTRAMUSCULAR | Status: AC
  Administered 2021-04-05: 1 ug via INTRAVENOUS
  Filled 2021-04-05 (×2): qty 0.25

## 2021-04-05 MED ORDER — CALCIUM GLUCONATE-NACL 1-0.675 GM/50ML-% IV SOLN
1.0000 g | Freq: Once | INTRAVENOUS | Status: AC
  Administered 2021-04-05: 1000 mg via INTRAVENOUS
  Filled 2021-04-05: qty 50

## 2021-04-05 NOTE — Progress Notes (Addendum)
[facility]                                                                              Patient Demographics  Rick Santiago, is a 45 y.o. male, DOB - Feb 18, 1976, GUR:[id number]  Admit date - 04/04/2021   Admitting Physician [name], MD  Outpatient Primary MD for the patient is Rick Moron, MD  Outpatient specialists:   LOS - 0  days   Medical records reviewed and are as summarized below:    Chief Complaint  Patient presents with   Weakness   Headache       Brief summary   Patient is a 45 year old male with a history of psoriatic arthritis for which he is on methotrexate weekly, last dose on 9/24.  Patient reported in ED that he was taking bowel prep for screening colonoscopy, scheduled for 9/26 (GI, Dr. [name]).  Patient drank first half of bowel prep around 5 PM.  By 7-7:30 PM he started having bowel movements, had 5-6 loose stools.  He was starting to take second half of the bowel prep when he became very lightheaded and weak.  He stated that his muscles ached all over, became dizzy but did not pass out.  After arrival in ED, patient felt some nausea but no vomiting. In ED, patient continued to feel very weak and lightheaded.  He was found to be severely hyponatremic with a sodium of 120, potassium 2.9.  Patient was placed on IV fluid hydration, potassium replacement. He was admitted for further work-up.  Assessment & Plan    Principal Problem: Acute hyponatremia with mental status changes - Patient presented to ED with diarrhea with bowel prep for screening colonoscopy and found to have sodium of 120, potassium 2.9.  Baseline sodium 137 on 03/02/2021 - Received bolus in ED and placed on 0.9% normal saline initially at 75 cc an hour, then increase to 125 cc an hour however no improvement in  Na - Was notified by RN about mental status changes.  On examination, patient is alert, confused, able to answer some questions and examination with  delayed responses.  Stat sodium 118, now critically low - Discussed with nephrology, [name], recommended 3% normal saline, 100 cc bolus then continue 40 cc/hour.  - Will transfer to stepdown unit, serial neurochecks, serial Na q2 hours, seizure precautions Addendum:  Na trended up to 128, 3% Nacl then discontinued and started on D5.  Na has now safely trended down to 125, patient is now oriented. Lowered the rate to 75cc/hr.  Discussed all the above with Dr Posey Pronto recommenced to keep D5 for 6hours, then dc drip.      Active Problems:   Hypokalemia, hypocalcemia -K 2.9 at the time of admission, replaced, now improved to 3.8  -Received IV calcium gluconate  Hypomagnesemia -Mag 1.3, gave mag sulfate 3 g IV x1  History of psoriatic arthritis -On methotrexate weekly, last dose on 9/24  Code Status: Full CODE STATUS DVT Prophylaxis:     Level of Care: Level of care: Stepdown Family Communication: Discussed all imaging results, lab results, explained to the patient's wife on the  phone   Disposition Plan:     Status is: Inpatient  Remains inpatient appropriate because:Inpatient level of care appropriate due to severity of illness  Dispo: The patient is from: Home              Anticipated d/c is to: Home              Patient currently is not medically stable to d/c.  Critically low sodium, transfer to stepdown   Difficult to place patient No   Critical care Time Spent in minutes   45 minutes  Procedures:  None  Consultants:   Nephrology, [name]  Antimicrobials:   Anti-infectives (From admission, onward)    None          Medications  Scheduled Meds: Continuous Infusions:  sodium chloride 1,000 mL (04/05/21 0820)   sodium chloride (hypertonic)     sodium chloride 3% (hypertonic)     PRN Meds:.sodium chloride, acetaminophen **OR** acetaminophen, ondansetron **OR** ondansetron (ZOFRAN) IV      Subjective:   [name] was seen and examined today.   Very confused, intermittently able to respond to some questions right but very delayed responses.  No ongoing vomiting or diarrhea.  No fevers  Objective:   Vitals:   04/05/21 0340 04/05/21 0819 04/05/21 0900 04/05/21 1000  BP: 116/66 110/68    Pulse:  77    Resp:  14 19 18   Temp: 97.6 F (36.4 C) 98.2 F (36.8 C)    TempSrc: Oral Oral    SpO2: 100% 96%    Weight:      Height:        Intake/Output Summary (Last 24 hours) at 04/05/2021 1052 Last data filed at 04/05/2021 1000 Gross per 24 hour  Intake 1265.44 ml  Output 1000 ml  Net 265.44 ml     Wt Readings from Last 3 Encounters:  04/04/21 65.8 kg  01/21/21 65.8 kg  08/11/20 63.4 kg     Exam General: Alert and awake, confused, oriented to self Cardiovascular: S1 S2 auscultated, RRR Respiratory: Clear to auscultation bilaterally Gastrointestinal: Soft, nontender, nondistended, + bowel sounds Ext: no pedal edema bilaterally Neuro: confused, able to follow some commands, moving all 4 extremities spontaneously Psych: confused  Data Reviewed:  I have personally reviewed following labs and imaging studies  Micro Results No results found for this or any previous visit (from the past 240 hour(s)).  Radiology Reports No results found.  Lab Data:  CBC: Recent Labs  Lab 04/05/21 0036 04/05/21 0350  WBC 8.8 12.1*  HGB 13.7 13.8  HCT 38.0* 37.8*  MCV 89.6 88.1  PLT 219 166   Basic Metabolic Panel: Recent Labs  Lab 04/05/21 0036 04/05/21 0606 04/05/21 1002  NA 120* 120* 118*  K 2.9* 3.8  --   CL 87* 87*  --   CO2 23 22  --   GLUCOSE 198* 144*  --   BUN 7 5*  --   CREATININE 0.88 0.70  --   CALCIUM 8.0* 7.8*  --   MG 1.7 1.3*  --    GFR: Estimated Creatinine Clearance: 106.2 mL/min (by C-G formula based on SCr of 0.7 mg/dL). Liver Function Tests: Recent Labs  Lab 04/05/21 0036  AST 31  ALT 20  ALKPHOS 57  BILITOT 1.0  PROT 6.4*  ALBUMIN 4.0   No results for input(s): LIPASE, AMYLASE in the  last 168 hours. No results for input(s): AMMONIA in the last 168 hours. Coagulation Profile: No  results for input(s): INR, PROTIME in the last 168 hours. Cardiac Enzymes: Recent Labs  Lab 04/05/21 0036  CKTOTAL 139   BNP (last 3 results) No results for input(s): PROBNP in the last 8760 hours. HbA1C: No results for input(s): HGBA1C in the last 72 hours. CBG: Recent Labs  Lab 04/05/21 0042  GLUCAP 218*   Lipid Profile: No results for input(s): CHOL, HDL, LDLCALC, TRIG, CHOLHDL, LDLDIRECT in the last 72 hours. Thyroid Function Tests: No results for input(s): TSH, T4TOTAL, FREET4, T3FREE, THYROIDAB in the last 72 hours. Anemia Panel: No results for input(s): VITAMINB12, FOLATE, FERRITIN, TIBC, IRON, RETICCTPCT in the last 72 hours. Urine analysis:    Component Value Date/Time   COLORURINE YELLOW 04/05/2021 1007   APPEARANCEUR CLEAR 04/05/2021 1007   LABSPEC 1.010 04/05/2021 1007   PHURINE 7.0 04/05/2021 1007   GLUCOSEU 250 (A) 04/05/2021 1007   HGBUR NEGATIVE 04/05/2021 1007   BILIRUBINUR NEGATIVE 04/05/2021 1007   KETONESUR 15 (A) 04/05/2021 1007   PROTEINUR NEGATIVE 04/05/2021 1007   NITRITE NEGATIVE 04/05/2021 1007   LEUKOCYTESUR NEGATIVE 04/05/2021 1007     Tamrah Victorino M.D. [facility] 04/05/2021, 10:52 AM  Available via Epic secure chat 7am-7pm After 7 pm, please refer to night coverage provider listed on amion.

## 2021-04-05 NOTE — Progress Notes (Signed)
Patients sodium levels have increased by 9 points within 2 hours. MD notified. New orders were given and followed. Will continue providing patient care and monitoring. Renae Gloss 04/05/2021

## 2021-04-05 NOTE — ED Provider Notes (Signed)
[facility] DEPT Provider Note   CSN: [id number] Arrival date & time: 04/04/21  2334     History Chief Complaint  Patient presents with   Weakness   Headache    Rick Santiago is a 45 y.o. male.  45 year old male with a history of rheumatoid arthritis presents to the ED for complaints of generalized weakness and lightheadedness which began after completion of his colonoscopy prep at 1900 tonight.  He started his prep 2 hours earlier.  Has had 2-4 bowel movements since prep completion.  States that he feels like his muscles are aching all over.  He has been lightheaded with a pressure sensation in his head.  Reports feeling as though he is going to pass out, but has not experienced any syncope prior to arrival.  Took his dose of methotrexate yesterday morning as he was cleared to take this medication by his doctor.  In the past 24 hours has only had 2 hard boiled eggs to eat.  Feels like his blood sugar may be low.  The history is provided by the patient. No language interpreter was used.  Weakness Associated symptoms: headaches   Headache Associated symptoms: weakness       Past Medical History:  Diagnosis Date   Arthritis    Psoriasis     Patient Active Problem List   Diagnosis Date Noted   Plantar fasciitis 09/29/2016   Psoriasis 09/29/2016   High risk medication use 09/29/2016   Psoriatic arthritis ([place]) 09/14/2016   FHx: hemochromatosis 09/14/2016   Encounter for methotrexate monitoring 09/14/2016   Family history of diabetes mellitus in mother 09/14/2016    History reviewed. No pertinent surgical history.     Family History  Problem Relation Age of Onset   Diabetes Mother    Heart disease Father    Irritable bowel syndrome Daughter     Social History   Tobacco Use   Smoking status: Never   Smokeless tobacco: Never  Vaping Use   Vaping Use: Never used  Substance Use Topics   Alcohol use: No   Drug use: No    Home  Medications Prior to Admission medications   Medication Sig Start Date End Date Taking? Authorizing Provider  folic acid (FOLVITE) 1 MG tablet Take 1 tablet (1 mg total) by mouth daily. 11/03/20  Yes Ofilia Neas, PA-C  methotrexate (RHEUMATREX) 2.5 MG tablet TAKE 5 TABLETS BY MOUTH ONCE WEEKLY. CAUTION:CHEMOTHERAPY. PROTECT FROM LIGHT. 01/25/21  Yes Deveshwar, Abel Presto, MD  polyethylene glycol-electrolytes (NULYTELY) 420 g solution Take by mouth as directed. 03/07/21  Yes [provider]    Allergies    Patient has no known allergies.  Review of Systems   Review of Systems  Neurological:  Positive for weakness and headaches.  Ten systems reviewed and are negative for acute change, except as noted in the HPI.    Physical Exam Updated Vital Signs BP 108/69   Pulse 75   Temp 98.1 F (36.7 C) (Oral)   Resp 20   Ht 5' 7"  (1.702 m)   Wt 65.8 kg   SpO2 99%   BMI 22.71 kg/m   Physical Exam Vitals and nursing note reviewed.  Constitutional:      Appearance: He is well-developed. He is ill-appearing. He is not toxic-appearing.     Comments: Pale appearing, mildly diaphoretic.  Appears unwell, but nontoxic.  HENT:     Head: Normocephalic and atraumatic.     Right Ear: External ear normal.  Left Ear: External ear normal.  Eyes:     General: No scleral icterus.    Conjunctiva/sclera: Conjunctivae normal.  Cardiovascular:     Rate and Rhythm: Normal rate and regular rhythm.     Heart sounds: Normal heart sounds.  Pulmonary:     Effort: Pulmonary effort is normal. No respiratory distress.     Breath sounds: No stridor. No wheezing or rales.     Comments: Lungs clear to auscultation bilaterally Abdominal:     Palpations: Abdomen is soft. There is no mass.     Tenderness: There is no abdominal tenderness.     Comments: Soft, nontender, nondistended abdomen  Musculoskeletal:        General: Normal range of motion.     Cervical back: Normal range of motion.  Skin:     General: Skin is warm and dry.     Coloration: Skin is not pale.     Findings: No erythema or rash.  Neurological:     Mental Status: He is alert and oriented to person, place, and time.     Coordination: Coordination normal.     Comments: Ambulatory from chair to exam bed without assistance  Psychiatric:        Behavior: Behavior normal.    ED Results / Procedures / Treatments   Labs (all labs ordered are listed, but only abnormal results are displayed) Labs Reviewed  CBC WITH DIFFERENTIAL/PLATELET - Abnormal; Notable for the following components:      Result Value   HCT 38.0 (*)    MCHC 36.1 (*)    nRBC 3 (*)    All other components within normal limits  COMPREHENSIVE METABOLIC PANEL - Abnormal; Notable for the following components:   Sodium 120 (*)    Potassium 2.9 (*)    Chloride 87 (*)    Glucose, Bld 198 (*)    Calcium 8.0 (*)    Total Protein 6.4 (*)    All other components within normal limits  CBG MONITORING, ED - Abnormal; Notable for the following components:   Glucose-Capillary 218 (*)    All other components within normal limits  SARS CORONAVIRUS 2 (TAT 6-24 HRS)  CK  MAGNESIUM    EKG None  Radiology No results found.  Procedures Procedures   Medications Ordered in ED Medications  potassium chloride 10 mEq in 100 mL IVPB (10 mEq Intravenous New Bag/Given 04/05/21 0154)  sodium chloride 0.9 % bolus 1,000 mL (0 mLs Intravenous Stopped 04/05/21 0150)  ondansetron (ZOFRAN) injection 4 mg (4 mg Intravenous Given 04/05/21 0153)  0.9 %  sodium chloride infusion ( Intravenous New Bag/Given 04/05/21 0154)    ED Course  I have reviewed the triage vital signs and the nursing notes.  Pertinent labs & imaging results that were available during my care of the patient were reviewed by me and considered in my medical decision making (see chart for details).  Clinical Course as of 04/05/21 0210  Mon Apr 05, 2021  0148 Patient vomiting.  Zofran ordered for  management.  He has completed 1 L IV fluids.  We will continue on normal saline infusion.  Consult placed to hospitalist for admission. [KH]    Clinical Course User Index [KH] Beverely Pace   MDM Rules/Calculators/A&P                           45 year old male presents to the emergency department for generalized weakness which began after  completion of his colonoscopy prep tonight.  Has had some lightheadedness, but no syncope.  He has been hemodynamically stable since arrival.  Did have onset of vomiting for which she received Zofran.  Given 1 L IV fluids pending work-up which reveals hyponatremia of 120 as well as hypokalemia, hypochloremia.  Will admit for management of electrolyte derangements.  Dr. Tonie Griffith to assess in the ED for admission.   Final Clinical Impression(s) / ED Diagnoses Final diagnoses:  Hyponatremia  Hypokalemia    Rx / DC Orders ED Discharge Orders     None        Antonietta Breach, PA-C 04/05/21 4142    Veryl Speak, MD 04/05/21 [phone]

## 2021-04-05 NOTE — ED Notes (Signed)
ED TO INPATIENT HANDOFF REPORT  Name/Age/Gender Rick Santiago 45 y.o. male  Code Status   Home/SNF/Other Home  Chief Complaint Hyponatremia [E87.1]  Level of Care/Admitting Diagnosis ED Disposition     ED Disposition  Admit   Condition  --   Comment  Hospital Area: [facility] [100102]  Level of Care: Telemetry [5]  Admit to tele based on following criteria: Complex arrhythmia (Bradycardia/Tachycardia)  May admit patient to [facility] or [facility] if equivalent level of care is available:: Yes  Covid Evaluation: Asymptomatic Screening Protocol (No Symptoms)  Diagnosis: Hyponatremia [294765]  Admitting Physician: [name] [[id number]]  Attending Physician: [name] [[id number]]  Estimated length of stay: past midnight tomorrow  Certification:: I certify this patient will need inpatient services for at least 2 midnights          Medical History Past Medical History:  Diagnosis Date   Arthritis    Psoriasis     Allergies No Known Allergies  IV Location/Drains/Wounds Patient Lines/Drains/Airways Status     Active Line/Drains/Airways     Name Placement date Placement time Site Days   Peripheral IV 04/05/21 20 G Anterior;Distal;Left;Upper Arm 04/05/21  0037  Arm  less than 1            Labs/Imaging Results for orders placed or performed during the hospital encounter of 04/04/21 (from the past 48 hour(s))  CBC with Differential     Status: Abnormal   Collection Time: 04/05/21 12:36 AM  Result Value Ref Range   WBC 8.8 4.0 - 10.5 K/uL   RBC 4.24 4.22 - 5.81 MIL/uL   Hemoglobin 13.7 13.0 - 17.0 g/dL   HCT 38.0 (L) 39.0 - 52.0 %   MCV 89.6 80.0 - 100.0 fL   MCH 32.3 26.0 - 34.0 pg   MCHC 36.1 (H) 30.0 - 36.0 g/dL   RDW 12.3 11.5 - 15.5 %   Platelets 219 150 - 400 K/uL   nRBC 0.0 0.0 - 0.2 %   Neutrophils Relative % 68 %   Band Neutrophils 1 %   Lymphocytes Relative 26 %   Monocytes Relative 5 %    Eosinophils Relative 0 %   Basophils Relative 0 %   WBC Morphology NORMAL    RBC Morphology NORMAL    Smear Review SMEAR STAINED AND AVAILABLE FOR REVIEW    nRBC 3 (H) 0 /100 WBC   Metamyelocytes Relative NONE SEEN %   Myelocytes NONE SEEN %   Promyelocytes Relative NONE SEEN %   Blasts NONE SEEN %    Comment: Performed at [facility], [place] [address], [place], [place] [address]  Comprehensive metabolic panel     Status: Abnormal   Collection Time: 04/05/21 12:36 AM  Result Value Ref Range   Sodium 120 (L) 135 - 145 mmol/L   Potassium 2.9 (L) 3.5 - 5.1 mmol/L   Chloride 87 (L) 98 - 111 mmol/L   CO2 23 22 - 32 mmol/L   Glucose, Bld 198 (H) 70 - 99 mg/dL    Comment: Glucose reference range applies only to samples taken after fasting for at least 8 hours.   BUN 7 6 - 20 mg/dL   Creatinine, Ser 0.88 0.61 - 1.24 mg/dL   Calcium 8.0 (L) 8.9 - 10.3 mg/dL   Total Protein 6.4 (L) 6.5 - 8.1 g/dL   Albumin 4.0 3.5 - 5.0 g/dL   AST 31 15 - 41 U/L   ALT 20 0 -  44 U/L   Alkaline Phosphatase 57 38 - 126 U/L   Total Bilirubin 1.0 0.3 - 1.2 mg/dL   GFR, Estimated >60 >60 mL/min    Comment: (NOTE) Calculated using the CKD-EPI Creatinine Equation (2021)    Anion gap 10 5 - 15    Comment: Performed at [facility], [place] [address], [place], [place] [address]  CK     Status: None   Collection Time: 04/05/21 12:36 AM  Result Value Ref Range   Total CK 139 49 - 397 U/L    Comment: Performed at [facility], [place] [address], [place], [place] [address]  Magnesium     Status: None   Collection Time: 04/05/21 12:36 AM  Result Value Ref Range   Magnesium 1.7 1.7 - 2.4 mg/dL    Comment: Performed at [facility], [place] [address] [place], [place] [address]  CBG monitoring, ED     Status: Abnormal   Collection Time: 04/05/21 12:42 AM  Result Value Ref Range   Glucose-Capillary 218 (H) 70 - 99 mg/dL    Comment: Glucose  reference range applies only to samples taken after fasting for at least 8 hours.   No results found.  Pending Labs [employer] (From admission, onward)     Start     Ordered   04/05/21 0138  SARS CORONAVIRUS 2 (TAT 6-24 HRS) Nasopharyngeal Nasopharyngeal Swab  (Tier 3 - Symptomatic/asymptomatic)  Once,   STAT       Question Answer Comment  Is this test for diagnosis or screening Screening   Symptomatic for COVID-19 as defined by CDC No   Hospitalized for COVID-19 No   Admitted to ICU for COVID-19 No   Previously tested for COVID-19 Yes   Resident in a congregate (group) care setting No   Employed in healthcare setting Unknown   Has patient completed COVID vaccination(s) (2 doses of Pfizer/Moderna 1 dose of [employer]) Unknown      04/05/21 0137            Vitals/Pain Today's Vitals   04/04/21 2344 04/04/21 2357 04/05/21 0130 04/05/21 0200  BP: (!) 139/91  117/68 108/69  Pulse: 91  75 75  Resp: 20   20  Temp: 98.1 F (36.7 C)     TempSrc: Oral     SpO2: 100%  100% 99%  Weight: 145 lb (65.8 kg)     Height: 5' 7"  (1.702 m)     PainSc:  3       Isolation Precautions No active isolations  Medications Medications  potassium chloride 10 mEq in 100 mL IVPB (10 mEq Intravenous New Bag/Given 04/05/21 0154)  sodium chloride 0.9 % bolus 1,000 mL (0 mLs Intravenous Stopped 04/05/21 0150)  ondansetron (ZOFRAN) injection 4 mg (4 mg Intravenous Given 04/05/21 0153)  0.9 %  sodium chloride infusion ( Intravenous New Bag/Given 04/05/21 0154)    Mobility walks with person assist

## 2021-04-05 NOTE — Progress Notes (Signed)
Date and time results received: 04/05/21 1358 (use smartphrase ".now" to insert current time)  Test: Sodium Critical Value: 119  Name of Provider Notified: Estill Cotta MD  Orders Received? Or Actions Taken?:  No new orders at this time.  Renae Gloss 04/05/2021

## 2021-04-05 NOTE — Consult Note (Signed)
Reason for Consult: Acute hyponatremia-symptomatic Referring Physician: Estill Cotta MD [facility])  HPI:  45 year old Caucasian man with a history of psoriasis with psoriatic arthritis (on methotrexate) who was preparing for a screening colonoscopy.  He had part of his bowel prep with about 5-6 loose bowel movements followed by global weakness and dizziness and muscle aches for which he presented to the emergency room.  He did not have any loss of consciousness but continued to express weakness and lightheadedness with increasing confusion this morning.  On presentation to the emergency room, he was found to be hyponatremic with a sodium of 120 (recently was 137 last month and as high as 140 earlier this year) and hypokalemia of 2.9.  He was started on intravenous fluids and potassium replacement however concerns raised with increasing confusion with stat sodium level of 118.  He was transferred to the stepdown unit and has been started on 3% saline at my direction with 100 cc bolus followed by infusion rate of 40 cc/min.  He is urine osmolality is elevated at 351 and serum osmolality is pending.  TSH and cortisol levels are pending.  Past Medical History:  Diagnosis Date   Arthritis    Psoriasis     History reviewed. No pertinent surgical history.  Family History  Problem Relation Age of Onset   Diabetes Mother    Heart disease Father    Irritable bowel syndrome Daughter     Social History:  reports that he has never smoked. He has never used smokeless tobacco. He reports that he does not drink alcohol and does not use drugs.  Allergies: No Known Allergies  Medications: I have reviewed the patient's current medications. Prior to Admission:  Medications Prior to Admission  Medication Sig Dispense Refill Last Dose   folic acid (FOLVITE) 1 MG tablet Take 1 tablet (1 mg total) by mouth daily. 90 tablet 3 Past Month   methotrexate (RHEUMATREX) 2.5 MG tablet TAKE 5 TABLETS BY MOUTH ONCE WEEKLY.  CAUTION:CHEMOTHERAPY. PROTECT FROM LIGHT. 60 tablet 0 04/04/2021   polyethylene glycol-electrolytes (NULYTELY) 420 g solution Take by mouth as directed.   04/04/2021 at unknown   Scheduled:  Chlorhexidine Gluconate Cloth  6 each Topical Daily    BMP Latest Ref Rng & Units 04/05/2021 04/05/2021 04/05/2021  Glucose 70 - 99 mg/dL - - 144(H)  BUN 6 - 20 mg/dL - - 5(L)  Creatinine 0.61 - 1.24 mg/dL - - 0.70  BUN/Creat Ratio 6 - 22 (calc) - - -  Sodium 135 - 145 mmol/L 118(LL) 118(LL) 120(L)  Potassium 3.5 - 5.1 mmol/L - - 3.8  Chloride 98 - 111 mmol/L - - 87(L)  CO2 22 - 32 mmol/L - - 22  Calcium 8.9 - 10.3 mg/dL - - 7.8(L)   CBC Latest Ref Rng & Units 04/05/2021 04/05/2021 03/02/2021  WBC 4.0 - 10.5 K/uL 12.1(H) 8.8 5.5  Hemoglobin 13.0 - 17.0 g/dL 13.8 13.7 15.3  Hematocrit 39.0 - 52.0 % 37.8(L) 38.0(L) 44.0  Platelets 150 - 400 K/uL 213 219 242     No results found.  Review of Systems  Constitutional:  Negative for chills and fever.  HENT:  Negative for nosebleeds, sore throat and trouble swallowing.   Eyes:  Negative for pain and redness.  Respiratory:  Negative for cough, chest tightness and shortness of breath.   Cardiovascular:  Negative for chest pain and leg swelling.  Gastrointestinal:  Positive for diarrhea and nausea. Negative for abdominal pain, blood in stool and vomiting.  Genitourinary:  Negative for difficulty urinating, frequency and hematuria.  Musculoskeletal:  Positive for myalgias. Negative for arthralgias and joint swelling.  Skin:  Negative for rash and wound.  Neurological:  Positive for weakness and light-headedness. Negative for tremors, numbness and headaches.  Psychiatric/Behavioral:  Positive for confusion. Negative for hallucinations. The patient is not nervous/anxious.   Blood pressure 110/68, pulse 77, temperature 98.2 F (36.8 C), temperature source Oral, resp. rate 16, height 5' 7"  (1.702 m), weight 65.8 kg, SpO2 96 %. Physical Exam Vitals and nursing  note reviewed.  Constitutional:      Appearance: He is normal weight. He is ill-appearing. He is not diaphoretic.     Comments: He is awake and alert and having some difficulty with recall  HENT:     Head: Normocephalic and atraumatic.     Mouth/Throat:     Mouth: Mucous membranes are moist.  Eyes:     Pupils: Pupils are equal, round, and reactive to light.  Cardiovascular:     Rate and Rhythm: Normal rate and regular rhythm.     Heart sounds: Normal heart sounds. No murmur heard. Pulmonary:     Effort: Pulmonary effort is normal.     Breath sounds: Normal breath sounds. No wheezing or rales.  Abdominal:     General: Bowel sounds are normal.     Palpations: Abdomen is soft.     Tenderness: There is no abdominal tenderness.  Musculoskeletal:        General: No swelling. Normal range of motion.     Cervical back: Normal range of motion and neck supple.  Lymphadenopathy:     Cervical: No cervical adenopathy.  Skin:    General: Skin is warm and dry.     Coloration: Skin is not pale.  Neurological:     Mental Status: He is lethargic and confused.  Psychiatric:        Cognition and Memory: Memory is impaired.     Comments: Slow to respond to questions, somewhat lethargic    Assessment/Plan: 1.  Hyponatremia: This appears to be acute hyponatremia (awaiting serum osmolality to verify that it is true hyponatremia).  He appears hypovolemic with inappropriately elevated urine osmolality suggesting that he has an inappropriately activated ADH in the setting of volume contraction.  He did not have significant improvement of sodium overnight with isotonic fluids and has had worsening neurological symptoms for which I recommended hypertonic 3% saline with frequent labs as ordered earlier by Dr. Tana Coast.  I will check cortisol and TSH level. 2.  Hypomagnesemia: Likely secondary to GI losses from bowel prep for colonoscopy.  Will replace intravenously. 3.  History of psoriatic arthritis: On weekly  methotrexate (last dose administered 9/24). 4.  Hypocalcemia: Unclear mechanism but likely related to magnesium depletion and PTH response, agree with replacement at this time and correct magnesium.  Gokul Waybright K. 04/05/2021, 12:34 PM

## 2021-04-05 NOTE — H&P (Signed)
History and Physical    Rick Santiago MRN:[id number] DOB: 07/02/1976 DOA: 04/04/2021  PCP: Stallings, Zoe A, MD   Patient coming from: Home  Chief Complaint: Weakness and lightheadedness.  HPI: Rick Santiago is a 45 y.o. male with medical history significant for RA for which he is on methotrexate once a week. Last dose of methotrexate was yesterday.  He reports that he was taking a bowel prep for colonoscopy that is scheduled for April 05, 2021.  He reports he drank the first half of the bowel prep around 5:00.  By 7-7 30 he was starting to have bowel movements and he had 5-6 loose stools.  He was starting to take the second half of the bowel prep when he began to feel very weak and lightheaded.  He states that his muscles ached all over.  He states that he became dizzy but did not pass out and he felt a pressure sensation in his head.  After arrival in the emergency room he did develop some nausea but did not vomit.  States he felt concerned about having low blood sugar and he was not sure why he was feeling this way.  He states he has never had these symptoms in the past.  He states he was having the colonoscopy as a screening colonoscopy.  He denies any known family history of early colon cancer and he has had no GI symptoms.   ED Course: Mr. Menzie has been hemodynamically stable in the emergency room although he continues to feel very weak and lightheaded if he sits up.  He was found to be hyponatremic with a sodium of 120 and hypokalemic with a potassium of 2.9.  He was given antiemetic and started on IV fluids and had potassium replacement started in the emergency room.  Lab work revealed a normal CBC and has a sodium of 120 potassium 2.9 chloride 87 bicarb 23 creatinine 0.88 BUN 7 magnesium 1.7 calcium 8.0 albumin 4.0 alk phos is 57 AST 31 ALT 20 CPK 139 glucose 198.  Hospitalist service was asked to admit for further management  Review of Systems:  General: Reports weakness and  dizziness. Denies fever, chills, weight loss, night sweats. Denies change in appetite HENT: Denies head trauma, headache, denies change in hearing, tinnitus.  Denies nasal congestion.  Denies sore throat Eyes: Denies blurry vision, pain in eye, drainage.  Denies discoloration of eyes. Neck: Denies pain.  Denies swelling.  Denies pain with movement. Cardiovascular: Denies chest pain, palpitations.  Denies edema.  Denies orthopnea Respiratory: Denies shortness of breath, cough.  Denies wheezing.  Denies sputum production Gastrointestinal: Denies abdominal pain, swelling. Denies melena.  Denies hematemesis. Musculoskeletal: Denies limitation of movement.  Denies deformity or swelling.  Denies pain.  Denies arthralgias or myalgias. Genitourinary: Denies pelvic pain.  Denies urinary frequency or hesitancy.  Denies dysuria.  Skin: Denies rash.  Denies petechiae, purpura, ecchymosis. Neurological: Denies syncope. Denies seizure activity. Denies slurred speech, drooping face. Denies visual change. Psychiatric: Denies depression, anxiety. Denies hallucinations.  Past Medical History:  Diagnosis Date   Arthritis    Psoriasis     History reviewed. No pertinent surgical history.  Social History  reports that he has never smoked. He has never used smokeless tobacco. He reports that he does not drink alcohol and does not use drugs.  No Known Allergies  Family History  Problem Relation Age of Onset   Diabetes Mother    Heart disease Father    Irritable bowel syndrome Daughter        Prior to Admission medications   Medication Sig Start Date End Date Taking? Authorizing Provider  folic acid (FOLVITE) 1 MG tablet Take 1 tablet (1 mg total) by mouth daily. 11/03/20  Yes Dale, Taylor M, PA-C  methotrexate (RHEUMATREX) 2.5 MG tablet TAKE 5 TABLETS BY MOUTH ONCE WEEKLY. CAUTION:CHEMOTHERAPY. PROTECT FROM LIGHT. 01/25/21  Yes Deveshwar, Shaili, MD  polyethylene glycol-electrolytes (NULYTELY) 420 g  solution Take by mouth as directed. 03/07/21  Yes [provider]    Physical Exam: Vitals:   04/04/21 2344 04/05/21 0130 04/05/21 0200  BP: (!) 139/91 117/68 108/69  Pulse: 91 75 75  Resp: 20  20  Temp: 98.1 F (36.7 C)    TempSrc: Oral    SpO2: 100% 100% 99%  Weight: 65.8 kg    Height: 5' 7" (1.702 m)      Constitutional: NAD, calm, comfortable Vitals:   04/04/21 2344 04/05/21 0130 04/05/21 0200  BP: (!) 139/91 117/68 108/69  Pulse: 91 75 75  Resp: 20  20  Temp: 98.1 F (36.7 C)    TempSrc: Oral    SpO2: 100% 100% 99%  Weight: 65.8 kg    Height: 5' 7" (1.702 m)     General: WDWN, Alert and oriented x3.  Eyes: EOMI, PERRL, conjunctivae normal.  Sclera nonicteric HENT:  [address]/AT, external ears normal.  Nares patent without epistasis.  Neck: Soft, normal range of motion, supple, no masses,Trachea midline Respiratory: clear to auscultation bilaterally, no wheezing, no crackles. Normal respiratory effort. No accessory muscle use.  Cardiovascular: Regular rate and rhythm, no murmurs / rubs / gallops. No extremity edema.  Abdomen: Soft, no tenderness, nondistended, no rebound or guarding. No masses palpated. No hepatosplenomegaly. Bowel sounds normoactive Musculoskeletal: FROM. no cyanosis. No joint deformity upper and lower extremities. no contractures. Normal muscle tone.  Skin: Warm, dry, intact no rashes, lesions, ulcers. No induration.  Decree skin turgor Neurologic: CN 2-12 grossly intact.  Normal speech.  Sensation intact. Strength 5/5 in all extremities.   Psychiatric: Normal judgment and insight.  Normal mood.    Labs on Admission: I have personally reviewed following labs and imaging studies  CBC: Recent Labs  Lab 04/05/21 0036  WBC 8.8  HGB 13.7  HCT 38.0*  MCV 89.6  PLT 219    Basic Metabolic Panel: Recent Labs  Lab 04/05/21 0036  NA 120*  K 2.9*  CL 87*  CO2 23  GLUCOSE 198*  BUN 7  CREATININE 0.88  CALCIUM 8.0*  MG 1.7     GFR: Estimated Creatinine Clearance: 96.6 mL/min (by C-G formula based on SCr of 0.88 mg/dL).  Liver Function Tests: Recent Labs  Lab 04/05/21 0036  AST 31  ALT 20  ALKPHOS 57  BILITOT 1.0  PROT 6.4*  ALBUMIN 4.0    Urine analysis: No results found for: COLORURINE, APPEARANCEUR, LABSPEC, PHURINE, GLUCOSEU, HGBUR, BILIRUBINUR, KETONESUR, PROTEINUR, UROBILINOGEN, NITRITE, LEUKOCYTESUR  Radiological Exams on Admission: No results found.  EKG: Independently reviewed.  EKG is reviewed and shows normal sinus rhythm with no acute ST elevation or depression.  No peaked T waves or flattened T waves.  QTc 414  Assessment/Plan Principal Problem:   Hyponatremia Rick Santiago is admitted to telemetry floor.  Monitor BMP every 6 hr starting at 0600.  IVF hydration with NS at 75 ml/hr. If sodium increases more than 4 mmol/L over 6 hours will slow rate.  Pt was taking bowel prep for colonoscopy last night which appears to have led to rapid decline in sodium   level. Pt had normal sodium level of 137 last month and no history of abnormal electrolytes.  Is on methotrexate but not high dose. He did take methotrexate yesterday so in combination with bowel prep may have contributed to hyponatremia.  Active Problems:   Hypokalemia Potassium will be repleted. Recheck electrolytes and renal function in am.  Magnesium level normal in am.     Hypocalemia Will replace calcium     Psoriatic arthritis  Pt on methotrexate once a week    DVT prophylaxis: Padua score low. Early ambulation as tolerated for DVT prophylaxis.   Code Status:   Full Code  Family Communication:  Diagnosis and plan discussed with patient.  Called patient's wife and informed her of patient's condition and plan.  They both agree with plan.  Further recommendations to follow as clinically indicated. Disposition Plan:   Patient is from:  Home  Anticipated DC to:  Home  Anticipated DC date:  Anticipate 2 midnight staty in  hospital to treat acute condition  Admission status:  Inpatient    S  MD [facility]  How to contact the [facility] Attending or Consulting provider 7A - 7P or covering provider during after hours 7P -7A, for this patient?   Check the care team in [facility] and look for a) attending/consulting [facility] provider listed and b) the [facility] team listed Log into [email or web address] and use [place]'s universal password to access. If you do not have the password, please contact the hospital operator. Locate the [facility] provider you are looking for under [facility] and page to a number that you can be directly reached. If you still have difficulty reaching the provider, please page the DOC (Director on Call) for the Hospitalists listed on amion for assistance.  04/05/2021, 2:49 AM     

## 2021-04-06 DIAGNOSIS — L4050 Arthropathic psoriasis, unspecified: Secondary | ICD-10-CM

## 2021-04-06 LAB — HEPATIC FUNCTION PANEL
ALT: 19 U/L (ref 0–44)
AST: 25 U/L (ref 15–41)
Albumin: 3.9 g/dL (ref 3.5–5.0)
Alkaline Phosphatase: 63 U/L (ref 38–126)
Bilirubin, Direct: 0.1 mg/dL (ref 0.0–0.2)
Indirect Bilirubin: 0.7 mg/dL (ref 0.3–0.9)
Total Bilirubin: 0.8 mg/dL (ref 0.3–1.2)
Total Protein: 6.2 g/dL — ABNORMAL LOW (ref 6.5–8.1)

## 2021-04-06 LAB — BASIC METABOLIC PANEL
Anion gap: 6 (ref 5–15)
Anion gap: 6 (ref 5–15)
BUN: 6 mg/dL (ref 6–20)
BUN: 6 mg/dL (ref 6–20)
CO2: 22 mmol/L (ref 22–32)
CO2: 23 mmol/L (ref 22–32)
Calcium: 8.6 mg/dL — ABNORMAL LOW (ref 8.9–10.3)
Calcium: 8.6 mg/dL — ABNORMAL LOW (ref 8.9–10.3)
Chloride: 107 mmol/L (ref 98–111)
Chloride: 108 mmol/L (ref 98–111)
Creatinine, Ser: 0.79 mg/dL (ref 0.61–1.24)
Creatinine, Ser: 0.78 mg/dL (ref 0.61–1.24)
GFR, Estimated: >60 mL/min (ref >60)
GFR, Estimated: >60 mL/min (ref >60)
Glucose, Bld: 109 mg/dL — ABNORMAL HIGH (ref 70–99)
Glucose, Bld: 107 mg/dL — ABNORMAL HIGH (ref 70–99)
Potassium: 4.2 mmol/L (ref 3.5–5.1)
Potassium: 4.3 mmol/L (ref 3.5–5.1)
Sodium: 135 mmol/L (ref 135–145)
Sodium: 137 mmol/L (ref 135–145)

## 2021-04-06 LAB — SODIUM
Sodium: 135 mmol/L (ref 135–145)
Sodium: 130 mmol/L — ABNORMAL LOW (ref 135–145)
Sodium: 131 mmol/L — ABNORMAL LOW (ref 135–145)
Sodium: 127 mmol/L — ABNORMAL LOW (ref 135–145)

## 2021-04-06 LAB — MAGNESIUM: Magnesium: 2.4 mg/dL (ref 1.7–2.4)

## 2021-04-06 MED ORDER — LACTATED RINGERS IV BOLUS
500.0000 mL | Freq: Once | INTRAVENOUS | Status: AC
  Administered 2021-04-06: 500 mL via INTRAVENOUS

## 2021-04-06 MED ORDER — DESMOPRESSIN ACETATE 4 MCG/ML IJ SOLN
1.0000 ug | Freq: Once | INTRAMUSCULAR | Status: AC
  Administered 2021-04-06: 1 ug via INTRAVENOUS
  Filled 2021-04-06: qty 1
  Filled 2021-04-06: qty 0.25

## 2021-04-06 NOTE — Progress Notes (Signed)
PT Cancellation Note / Screen  Patient Details Name: Rick Santiago MRN: [id number] DOB: [birth year]   Cancelled Treatment:    Reason Eval/Treat Not Completed: PT screened, no needs identified, will sign off  RN and pt feel PT evaluation is not needed at this time.  Pt has been up ambulating around unit a few times.  RN aware to request new order if needs change however PT to sign off at this time.   Myrtis Hopping [place] 04/06/2021, 2:43 PM Jannette Spanner PT, DPT Acute Rehabilitation Services Pager: [phone] Office: [phone]

## 2021-04-06 NOTE — Progress Notes (Signed)
Patient ID: Rick Santiago, male   DOB: Feb 28, 1976, 45 y.o.   MRN: [id number] [facility] Progress Note   Assessment/ Plan:   1.  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  Received 3% saline after manifesting neurological symptoms and had rapid rise of sodium level prompting DDAVP and D5W administration.  Neurologically appears to be back to baseline and will be monitored for an additional 24 hours with labs/clinical exam. 2.  Hypomagnesemia: Likely secondary to GI losses from bowel prep for colonoscopy.  Replaced intravenously. 3.  History of psoriatic arthritis: On weekly methotrexate (last dose administered 9/24). 4.  Hypocalcemia: Calcium level back to normal range status post replacement of magnesium.  Subjective:   Reports to be feeling much better and ambulated around hallways with assistance.   Objective:   BP 110/64   Pulse 73   Temp 97.7 F (36.5 C) (Oral)   Resp 15   Ht 5' 7"  (1.702 m)   Wt 65.8 kg   SpO2 100%   BMI 22.71 kg/m   Intake/Output Summary (Last 24 hours) at 04/06/2021 1244 Last data filed at 04/06/2021 9672 Gross per 24 hour  Intake 2584.76 ml  Output 1100 ml  Net 1484.76 ml   Weight change:   Physical Exam: Gen: Comfortably sitting up in recliner, wife at bedside CVS: Pulse regular rhythm, normal rate, S1 and S2 normal Resp: Clear to auscultation bilaterally, no rales/rhonchi Abd: Soft, flat, nontender, bowel sounds normal Ext: No lower extremity edema  Imaging: No results found.  Labs: BMET Recent Labs  Lab 04/05/21 0036 04/05/21 0606 04/05/21 1002 04/05/21 1451 04/05/21 1646 04/05/21 1902 04/05/21 2105 04/06/21 0046 04/06/21 0251 04/06/21 0617  NA 120* 120*   < > 128* 125* 135 133* 135 135 137  K 2.9* 3.8  --   --   --   --   --   --  4.2 4.3  CL 87* 87*  --   --   --   --   --   --  107 108  CO2 23 22  --   --   --    --   --   --  22 23  GLUCOSE 198* 144*  --   --   --   --   --   --  109* 107*  BUN 7 5*  --   --   --   --   --   --  6 6  CREATININE 0.88 0.70  --   --   --   --   --   --  0.79 0.78  CALCIUM 8.0* 7.8*  --   --   --   --   --   --  8.6* 8.6*   < > = values in this interval not displayed.   CBC Recent Labs  Lab 04/05/21 0036 04/05/21 0350  WBC 8.8 12.1*  HGB 13.7 13.8  HCT 38.0* 37.8*  MCV 89.6 88.1  PLT 219 213    Medications:     Chlorhexidine Gluconate Cloth  6 each Topical Daily   Elmarie Shiley, MD 04/06/2021, 12:44 PM

## 2021-04-06 NOTE — Plan of Care (Signed)

## 2021-04-06 NOTE — Progress Notes (Signed)
[facility]                                                                              Patient Demographics  Rick Santiago, is a 45 y.o. male, DOB - [birth year], GYI:[id number]  Admit date - 04/04/2021   Admitting Physician Rick Burow, MD  Outpatient Primary MD for the patient is Rick Moron, MD  Outpatient specialists:   LOS - 1  days   Medical records reviewed and are as summarized below:    Chief Complaint  Patient presents with   Weakness   Headache       Brief summary   Patient is a 45 year old male with a history of psoriatic arthritis for which he is on methotrexate weekly, last dose on 9/24.  Patient reported in ED that he was taking bowel prep for screening colonoscopy, scheduled for 9/26 (GI, Dr. Therisa Santiago).  Patient drank first half of bowel prep around 5 PM.  By 7-7:30 PM he started having bowel movements, had 5-6 loose stools.  He was starting to take second half of the bowel prep when he became very lightheaded and weak.  He stated that his muscles ached all over, became dizzy but did not pass out.  After arrival in ED, patient felt some nausea but no vomiting. In ED, patient continued to feel very weak and lightheaded.  He was found to be severely hyponatremic with a sodium of 120, potassium 2.9.  Patient was placed on IV fluid hydration, potassium replacement. He was admitted for further work-up.  Assessment & Plan    Principal Problem: Acute hyponatremia with mental status changes - Patient presented to ED with diarrhea with bowel prep for screening colonoscopy and found to have sodium of 120, potassium 2.9.  Baseline sodium 137 on 03/02/2021 -Due to mental status changes and critically low sodium, 118, patient was transferred to SDU on 9/26, started on 3% normal saline, nephrology consulted -Patient had rapid rise in the sodium level, 3% was discontinued and patient was placed on D5W with DDAVP, sodium now trending down  -Patient  is alert and oriented, neurologically intact, ambulating. -Monitor additionally for 24 hours     Active Problems:   Hypokalemia, hypocalcemia -K 2.9 at the time of admission, replaced -Calcium replaced IV  Hypomagnesemia -Mag 1.3, received IV mag replacement, now 2.4  History of psoriatic arthritis -On methotrexate weekly, last dose on 9/24  Code Status: Full CODE STATUS DVT Prophylaxis:  Place and maintain sequential compression device Start: 04/05/21 1107   Level of Care: Level of care: Stepdown Family Communication: Discussed all imaging results, lab results, explained to the patient and his wife at the bedside   Disposition Plan:     Status is: Inpatient  Remains inpatient appropriate because:Inpatient level of care appropriate due to severity of illness  Dispo: The patient is from: Home              Anticipated d/c is to: Home              Patient currently is not medically stable to d/c.  Monitor for additional 24 hours for sodium/labs and clinical  stability before discharging home   difficult to place patient No   Critical care Time Spent in minutes   25 minutes  Procedures:  None  Consultants:   Nephrology, Dr. Posey Santiago  Antimicrobials:   Anti-infectives (From admission, onward)    None          Medications  Scheduled Meds:  Chlorhexidine Gluconate Cloth  6 each Topical Daily   Continuous Infusions:  sodium chloride Stopped (04/05/21 1604)   dextrose 175 mL/hr at 04/06/21 1209   PRN Meds:.sodium chloride, acetaminophen **OR** acetaminophen, ondansetron **OR** ondansetron (ZOFRAN) IV      Subjective:   Rick Santiago was seen and examined today.  Alert and oriented x3, no cognitive deficits, no neurodeficits.  Wife at the bedside, appears back to baseline.  No ongoing nausea vomiting or diarrhea.  Tolerating diet  Objective:   Vitals:   04/06/21 1000 04/06/21 1100 04/06/21 1200 04/06/21 1209  BP: 106/72 125/84 110/64   Pulse: 64 (!) 57  73   Resp: 16 14 15    Temp:    97.7 F (36.5 C)  TempSrc:    Oral  SpO2: 99% 100% 100%   Weight:      Height:        Intake/Output Summary (Last 24 hours) at 04/06/2021 1445 Last data filed at 04/06/2021 1749 Gross per 24 hour  Intake 2342.57 ml  Output 600 ml  Net 1742.57 ml     Wt Readings from Last 3 Encounters:  04/04/21 65.8 kg  01/21/21 65.8 kg  08/11/20 63.4 kg   Physical Exam General: Alert and oriented x 3, NAD Cardiovascular: S1 S2 clear, RRR. No pedal edema b/l Respiratory: CTAB, no wheezing Gastrointestinal: Soft, nontender, nondistended, NBS Ext: no pedal edema bilaterally Neuro: strength 5/5 in upper and lower extremities, no FND's. Psych: Normal affect and demeanor, alert and oriented x3    Data Reviewed:  I have personally reviewed following labs and imaging studies  Micro Results Recent Results (from the past 240 hour(s))  SARS CORONAVIRUS 2 (TAT 6-24 HRS) Nasopharyngeal Nasopharyngeal Swab     Status: None   Collection Time: 04/05/21  2:00 AM   Specimen: Nasopharyngeal Swab  Result Value Ref Range Status   SARS Coronavirus 2 NEGATIVE NEGATIVE Final    Comment: (NOTE) SARS-CoV-2 target nucleic acids are NOT DETECTED.  The SARS-CoV-2 RNA is generally detectable in upper and lower respiratory specimens during the acute phase of infection. Negative results do not preclude SARS-CoV-2 infection, do not rule out co-infections with other pathogens, and should not be used as the sole basis for treatment or other patient management decisions. Negative results must be combined with clinical observations, patient history, and epidemiological information. The expected result is Negative.  Fact Sheet for Patients: SugarRoll.be  Fact Sheet for Healthcare Providers: https://www.woods-mathews.com/  This test is not yet approved or cleared by the [place] FDA and  has been authorized for detection and/or  diagnosis of SARS-CoV-2 by FDA under an Emergency Use Authorization (EUA). This EUA will remain  in effect (meaning this test can be used) for the duration of the COVID-19 declaration under Se ction 564(b)(1) of the Act, 21 U.S.C. section 360bbb-3(b)(1), unless the authorization is terminated or revoked sooner.  Performed at [facility], [place] [address], [place], [place] [address]   MRSA Next Gen by PCR, Nasal     Status: None   Collection Time: 04/05/21 12:10 PM   Specimen: Nasal Mucosa; Nasal Swab  Result Value Ref Range  Status   MRSA by PCR Next Gen NOT DETECTED NOT DETECTED Final    Comment: (NOTE) The GeneXpert MRSA Assay (FDA approved for NASAL specimens only), is one component of a comprehensive MRSA colonization surveillance program. It is not intended to diagnose MRSA infection nor to guide or monitor treatment for MRSA infections. Test performance is not FDA approved in patients less than 17 years old. Performed at [facility], [place] [address], [place], [place] [address]     Radiology Reports No results found.  Lab Data:  CBC: Recent Labs  Lab 04/05/21 0036 04/05/21 0350  WBC 8.8 12.1*  HGB 13.7 13.8  HCT 38.0* 37.8*  MCV 89.6 88.1  PLT 219 315   Basic Metabolic Panel: Recent Labs  Lab 04/05/21 0036 04/05/21 0606 04/05/21 1002 04/05/21 2105 04/06/21 0046 04/06/21 0251 04/06/21 0617 04/06/21 1300  NA 120* 120*   < > 133* 135 135 137 130*  K 2.9* 3.8  --   --   --  4.2 4.3  --   CL 87* 87*  --   --   --  107 108  --   CO2 23 22  --   --   --  22 23  --   GLUCOSE 198* 144*  --   --   --  109* 107*  --   BUN 7 5*  --   --   --  6 6  --   CREATININE 0.88 0.70  --   --   --  0.79 0.78  --   CALCIUM 8.0* 7.8*  --   --   --  8.6* 8.6*  --   MG 1.7 1.3*  --   --   --  2.4  --   --    < > = values in this interval not displayed.   GFR: Estimated Creatinine Clearance: 106.2 mL/min (by C-G formula based on SCr of 0.78  mg/dL). Liver Function Tests: Recent Labs  Lab 04/05/21 0036 04/06/21 0617  AST 31 25  ALT 20 19  ALKPHOS 57 63  BILITOT 1.0 0.8  PROT 6.4* 6.2*  ALBUMIN 4.0 3.9   No results for input(s): LIPASE, AMYLASE in the last 168 hours. No results for input(s): AMMONIA in the last 168 hours. Coagulation Profile: No results for input(s): INR, PROTIME in the last 168 hours. Cardiac Enzymes: Recent Labs  Lab 04/05/21 0036  CKTOTAL 139   BNP (last 3 results) No results for input(s): PROBNP in the last 8760 hours. HbA1C: No results for input(s): HGBA1C in the last 72 hours. CBG: Recent Labs  Lab 04/05/21 0042  GLUCAP 218*   Lipid Profile: No results for input(s): CHOL, HDL, LDLCALC, TRIG, CHOLHDL, LDLDIRECT in the last 72 hours. Thyroid Function Tests: Recent Labs    04/05/21 1302  TSH 0.884   Anemia Panel: No results for input(s): VITAMINB12, FOLATE, FERRITIN, TIBC, IRON, RETICCTPCT in the last 72 hours. Urine analysis:    Component Value Date/Time   COLORURINE YELLOW 04/05/2021 1007   APPEARANCEUR CLEAR 04/05/2021 1007   LABSPEC 1.010 04/05/2021 1007   PHURINE 7.0 04/05/2021 1007   GLUCOSEU 250 (A) 04/05/2021 1007   HGBUR NEGATIVE 04/05/2021 1007   BILIRUBINUR NEGATIVE 04/05/2021 1007   KETONESUR 15 (A) 04/05/2021 1007   PROTEINUR NEGATIVE 04/05/2021 1007   NITRITE NEGATIVE 04/05/2021 1007   LEUKOCYTESUR NEGATIVE 04/05/2021 1007     Jamarco Zaldivar M.D. [facility] 04/06/2021, 2:45 PM  Available via Epic secure chat  7am-7pm After 7 pm, please refer to night coverage provider listed on amion.

## 2021-04-07 LAB — RENAL FUNCTION PANEL
Albumin: 3.9 g/dL (ref 3.5–5.0)
Anion gap: 7 (ref 5–15)
BUN: 10 mg/dL (ref 6–20)
CO2: 26 mmol/L (ref 22–32)
Calcium: 8.6 mg/dL — ABNORMAL LOW (ref 8.9–10.3)
Chloride: 93 mmol/L — ABNORMAL LOW (ref 98–111)
Creatinine, Ser: 0.7 mg/dL (ref 0.61–1.24)
GFR, Estimated: >60 mL/min (ref >60)
Glucose, Bld: 106 mg/dL — ABNORMAL HIGH (ref 70–99)
Phosphorus: 3.2 mg/dL (ref 2.5–4.6)
Potassium: 3.8 mmol/L (ref 3.5–5.1)
Sodium: 126 mmol/L — ABNORMAL LOW (ref 135–145)

## 2021-04-07 LAB — BASIC METABOLIC PANEL
Anion gap: 6 (ref 5–15)
Anion gap: 8 (ref 5–15)
BUN: 12 mg/dL (ref 6–20)
BUN: 9 mg/dL (ref 6–20)
CO2: 25 mmol/L (ref 22–32)
CO2: 25 mmol/L (ref 22–32)
Calcium: 8.3 mg/dL — ABNORMAL LOW (ref 8.9–10.3)
Calcium: 9.6 mg/dL (ref 8.9–10.3)
Chloride: 93 mmol/L — ABNORMAL LOW (ref 98–111)
Chloride: 101 mmol/L (ref 98–111)
Creatinine, Ser: 0.68 mg/dL (ref 0.61–1.24)
Creatinine, Ser: 0.73 mg/dL (ref 0.61–1.24)
GFR, Estimated: >60 mL/min (ref >60)
GFR, Estimated: >60 mL/min (ref >60)
Glucose, Bld: 104 mg/dL — ABNORMAL HIGH (ref 70–99)
Glucose, Bld: 130 mg/dL — ABNORMAL HIGH (ref 70–99)
Potassium: 3.4 mmol/L — ABNORMAL LOW (ref 3.5–5.1)
Potassium: 3.9 mmol/L (ref 3.5–5.1)
Sodium: 124 mmol/L — ABNORMAL LOW (ref 135–145)
Sodium: 134 mmol/L — ABNORMAL LOW (ref 135–145)

## 2021-04-07 MED ORDER — ONDANSETRON HCL 4 MG PO TABS: 4.0000 mg | ORAL_TABLET | Freq: Three times a day (TID) | ORAL | 0 refills | Status: DC | PRN

## 2021-04-07 NOTE — Plan of Care (Signed)
  Problem: Education: Goal: Knowledge of General Education information will improve Description: Including pain rating scale, medication(s)/side effects and non-pharmacologic comfort measures 04/07/2021 1519 by Elyn Aquas, RN Outcome: Adequate for Discharge 04/07/2021 1519 by Elyn Aquas, RN Outcome: Adequate for Discharge   Problem: Health Behavior/Discharge Planning: Goal: Ability to manage health-related needs will improve 04/07/2021 1519 by Elyn Aquas, RN Outcome: Adequate for Discharge 04/07/2021 1519 by Elyn Aquas, RN Outcome: Adequate for Discharge   Problem: Clinical Measurements: Goal: Ability to maintain clinical measurements within normal limits will improve 04/07/2021 1519 by Elyn Aquas, RN Outcome: Adequate for Discharge 04/07/2021 1519 by Elyn Aquas, RN Outcome: Adequate for Discharge Goal: Will remain free from infection 04/07/2021 1519 by Elyn Aquas, RN Outcome: Adequate for Discharge 04/07/2021 1519 by Elyn Aquas, RN Outcome: Adequate for Discharge Goal: Diagnostic test results will improve 04/07/2021 1519 by Elyn Aquas, RN Outcome: Adequate for Discharge 04/07/2021 1519 by Elyn Aquas, RN Outcome: Adequate for Discharge Goal: Respiratory complications will improve 04/07/2021 1519 by Elyn Aquas, RN Outcome: Adequate for Discharge 04/07/2021 1519 by Elyn Aquas, RN Outcome: Adequate for Discharge Goal: Cardiovascular complication will be avoided 04/07/2021 1519 by Elyn Aquas, RN Outcome: Adequate for Discharge 04/07/2021 1519 by Elyn Aquas, RN Outcome: Adequate for Discharge   Problem: Activity: Goal: Risk for activity intolerance will decrease 04/07/2021 1519 by Elyn Aquas, RN Outcome: Adequate for Discharge 04/07/2021 1519 by Elyn Aquas, RN Outcome: Adequate for Discharge   Problem: Nutrition: Goal: Adequate nutrition will be maintained 04/07/2021 1519 by Elyn Aquas, RN Outcome: Adequate for  Discharge 04/07/2021 1519 by Elyn Aquas, RN Outcome: Adequate for Discharge   Problem: Coping: Goal: Level of anxiety will decrease 04/07/2021 1519 by Elyn Aquas, RN Outcome: Adequate for Discharge 04/07/2021 1519 by Elyn Aquas, RN Outcome: Adequate for Discharge   Problem: Elimination: Goal: Will not experience complications related to bowel motility 04/07/2021 1519 by Elyn Aquas, RN Outcome: Adequate for Discharge 04/07/2021 1519 by Elyn Aquas, RN Outcome: Adequate for Discharge Goal: Will not experience complications related to urinary retention 04/07/2021 1519 by Elyn Aquas, RN Outcome: Adequate for Discharge 04/07/2021 1519 by Elyn Aquas, RN Outcome: Adequate for Discharge   Problem: Pain Managment: Goal: General experience of comfort will improve 04/07/2021 1519 by Elyn Aquas, RN Outcome: Adequate for Discharge 04/07/2021 1519 by Elyn Aquas, RN Outcome: Adequate for Discharge   Problem: Safety: Goal: Ability to remain free from injury will improve 04/07/2021 1519 by Elyn Aquas, RN Outcome: Adequate for Discharge 04/07/2021 1519 by Elyn Aquas, RN Outcome: Adequate for Discharge   Problem: Skin Integrity: Goal: Risk for impaired skin integrity will decrease 04/07/2021 1519 by Elyn Aquas, RN Outcome: Adequate for Discharge 04/07/2021 1519 by Elyn Aquas, RN Outcome: Adequate for Discharge

## 2021-04-07 NOTE — Progress Notes (Signed)
Patient ID: Rick Santiago, male   DOB: 10-16-1975, 45 y.o.   MRN: [id number] [facility] Progress Note   Assessment/ Plan:   1.  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  Sodium level corrected with 3% hypertonic saline followed by need to administer DDAVP/D5W because of rapid rise.  Today, sodium level at acceptable range to allow for discharge with stable neurological status.  I will set him up for repeat labs in 1 week that we will review over the phone (he does not have a PCP at this time). 2.  Hypomagnesemia: Likely secondary to GI losses from bowel prep for colonoscopy.  Replaced intravenously. 3.  History of psoriatic arthritis: On weekly methotrexate (last dose administered 9/24). 4.  Hypocalcemia: Calcium level back to normal range status post replacement of magnesium.  Subjective:   Reports that he continues to feel well and had an episode or 2 of nausea overnight.  He denies any vomiting, chest pain or instability while walking.   Objective:   BP 107/69   Pulse 72   Temp 97.8 F (36.6 C) (Oral)   Resp 14   Ht 5' 7"  (1.702 m)   Wt 65.8 kg   SpO2 100%   BMI 22.71 kg/m   Intake/Output Summary (Last 24 hours) at 04/07/2021 1357 Last data filed at 04/07/2021 1200 Gross per 24 hour  Intake 3134.12 ml  Output 4720 ml  Net -1585.88 ml   Weight change:   Physical Exam: Gen: Comfortably resting in bed, wife at bedside CVS: Pulse regular rhythm, normal rate, S1 and S2 normal Resp: Clear to auscultation bilaterally, no rales/rhonchi Abd: Soft, flat, nontender, bowel sounds normal Ext: No lower extremity edema  Imaging: No results found.  Labs: BMET Recent Labs  Lab 04/05/21 0036 04/05/21 0606 04/05/21 1002 04/06/21 0251 04/06/21 0617 04/06/21 1300 04/06/21 1619 04/06/21 2040 04/07/21 0039 04/07/21 0756 04/07/21 1232  NA 120* 120*   < > 135 137  130* 131* 127* 124* 126* 134*  K 2.9* 3.8  --  4.2 4.3  --   --   --  3.4* 3.8 3.9  CL 87* 87*  --  107 108  --   --   --  93* 93* 101  CO2 23 22  --  22 23  --   --   --  25 26 25   GLUCOSE 198* 144*  --  109* 107*  --   --   --  104* 106* 130*  BUN 7 5*  --  6 6  --   --   --  12 10 9   CREATININE 0.88 0.70  --  0.79 0.78  --   --   --  0.68 0.70 0.73  CALCIUM 8.0* 7.8*  --  8.6* 8.6*  --   --   --  8.3* 8.6* 9.6  PHOS  --   --   --   --   --   --   --   --   --  3.2  --    < > = values in this interval not displayed.   CBC Recent Labs  Lab 04/05/21 0036 04/05/21 0350  WBC 8.8 12.1*  HGB 13.7 13.8  HCT 38.0* 37.8*  MCV 89.6 88.1  PLT 219 213    Medications:     Chlorhexidine Gluconate Cloth  6 each Topical Daily   Elmarie Shiley, MD 04/07/2021, 1:57 PM

## 2021-04-07 NOTE — Discharge Summary (Signed)
Discharge Summary  Rick Santiago MBT:[id number] DOB: 1976/07/02  PCP: [name], MD  Admit date: 04/04/2021 Discharge date: 04/07/2021  Time spent: 21mns  Recommendations for Outpatient Follow-up:  F/u with PCP  Nephrology Dr PPosey Prontowill check bmp on 10/5  Discharge Diagnoses:  Active Hospital Problems   Diagnosis Date Noted   Hyponatremia 04/05/2021   Hypokalemia 04/05/2021   Hypocalcemia 04/05/2021   [place]atic [place] 09/14/2016    Resolved Hospital Problems  No resolved problems to display.    Discharge Condition: stable  Diet recommendation: regular diet  Filed Weights   04/04/21 2344  Weight: 65.8 kg    History of present illness: ( per admitting MD Dr CTonie Griffith  Chief Complaint: Weakness and lightheadedness.   HPI: Rick Spratlinis a 45y.o. male with medical history significant for RA for which he is on methotrexate once a week. Last dose of methotrexate was yesterday.  He reports that he was taking a bowel prep for colonoscopy that is scheduled for April 05, 2021.  He reports he drank the first half of the bowel prep around 5:00.  By 7-7 30 he was starting to have bowel movements and he had 5-6 loose stools.  He was starting to take the second half of the bowel prep when he began to feel very weak and lightheaded.  He states that his muscles ached all over.  He states that he became dizzy but did not pass out and he felt a pressure sensation in his head.  After arrival in the emergency room he did develop some nausea but did not vomit.  States he felt concerned about having low blood sugar and he was not sure why he was feeling this way.  He states he has never had these symptoms in the past.  He states he was having the colonoscopy as a screening colonoscopy.  He denies any known family history of early colon cancer and he has had no GI symptoms.    ED Course: Rick Santiago hemodynamically stable in the emergency room although he  continues to feel very weak and lightheaded if he sits up.  He was found to be hyponatremic with a sodium of 120 and hypokalemic with a potassium of 2.9.  He was given antiemetic and started on IV fluids and had potassium replacement started in the emergency room.  Lab work revealed a normal CBC and has a sodium of 120 potassium 2.9 chloride 87 bicarb 23 creatinine 0.88 BUN 7 magnesium 1.7 calcium 8.0 albumin 4.0 alk phos is 57 AST 31 ALT 20 CPK 139 glucose 198.  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  Sodium level corrected with 3% hypertonic saline followed by need to administer DDAVP/D5W because of rapid rise.  Today, sodium level at acceptable range to allow for discharge with stable neurological status.  I will set him up for repeat labs in 1 week that we will review over the phone (he does not have a PCP at this time)."   Hypomagnesemia: Likely secondary to GI losses from bowel prep for colonoscopy.  Replaced intravenously. hypocalcemia: Calcium level back to normal range status post replacement of magnesium and calcium  History  of psoriatic arthritis: stable On weekly methotrexate (last dose administered 9/24).     Procedures: none  Consultations: nephrology  Discharge Exam: BP 107/69   Pulse 72   Temp 97.8 F (36.6 C) (Oral)   Resp 14   Ht _0  (1.702 m)   Wt 65.8 kg   SpO2 100%   BMI 22.71 kg/m   General: NAD Cardiovascular: RRR Respiratory: normal respiratory effort  Discharge Instructions You were cared for by a hospitalist during your hospital stay. If you have any questions about your discharge  medications or the care you received while you were in the hospital after you are discharged, you can call the unit and asked to speak with the hospitalist on call if the hospitalist that took care of you is not available. Once you are discharged, your primary care physician will handle any further medical issues. Please note that NO REFILLS for any discharge medications will be authorized once you are discharged, as it is imperative that you return to your primary care physician (or establish a relationship with a primary care physician if you do not have one) for your aftercare needs so that they can reassess your need for medications and monitor your lab values.  Discharge Instructions     Diet general   Complete by: As directed    Increase activity slowly   Complete by: As directed       Allergies as of 04/07/2021   No Known Allergies      Medication List     STOP taking these medications    polyethylene glycol-electrolytes 420 g solution Commonly known as: NuLYTELY       TAKE these medications    folic acid 1 MG tablet Commonly known as: FOLVITE Take 1 tablet (1 mg total) by mouth daily.   methotrexate 2.5 MG tablet Commonly known as: RHEUMATREX TAKE 5 TABLETS BY MOUTH ONCE WEEKLY. CAUTION:CHEMOTHERAPY. PROTECT FROM LIGHT.   ondansetron 4 MG tablet Commonly known as: ZOFRAN Take 1 tablet (4 mg total) by mouth every 8 (eight) hours as needed for nausea.       No Known Allergies  Follow-up Information     [name], MD Follow up.   Specialty: Internal Medicine Contact information: [place] [address] [phone]         Elmarie Shiley, MD Follow up.   Specialty: Nephrology Contact information: [place] [address] [phone]                  The results of significant diagnostics from this hospitalization (including imaging, microbiology, ancillary and laboratory) are listed below for reference.     Significant Diagnostic Studies: No results found.  Microbiology: Recent Results (from the past 240 hour(s))  SARS CORONAVIRUS 2 (TAT 6-24 HRS) Nasopharyngeal Nasopharyngeal Swab     Status: None   Collection Time: 04/05/21  2:00 AM   Specimen: Nasopharyngeal Swab  Result Value Ref Range Status   SARS Coronavirus 2 NEGATIVE NEGATIVE Final    Comment: (NOTE) SARS-CoV-2 target nucleic acids are NOT DETECTED.  The SARS-CoV-2 RNA is generally detectable in upper and lower respiratory specimens during the acute phase of infection. Negative results do not preclude SARS-CoV-2 infection, do not rule out co-infections with other pathogens, and should not be used as the sole basis for treatment or other patient management decisions. Negative results must be combined with clinical observations, patient history, and epidemiological information. The expected result is Negative.  Fact Sheet  for Patients: SugarRoll.be  Fact Sheet for Healthcare Providers: https://www.woods-mathews.com/  This test is not yet approved or cleared by the [place] FDA and  has Santiago authorized for detection and/or diagnosis of SARS-CoV-2 by FDA under an Emergency Use Authorization (EUA). This EUA will remain  in effect (meaning this test can be used) for the duration of the COVID-19 declaration under Se ction 564(b)(1) of the Act, 21 U.S.C. section 360bbb-3(b)(1), unless the authorization is terminated or revoked sooner.  Performed at [facility], [place] [address], [place], [place] [address]   MRSA Next Gen by PCR, Nasal     Status: None   Collection Time: 04/05/21 12:10 PM   Specimen: Nasal Mucosa; Nasal Swab  Result Value Ref Range Status   MRSA by PCR Next Gen NOT DETECTED NOT DETECTED Final    Comment: (NOTE) The GeneXpert MRSA Assay (FDA approved for NASAL specimens only), is one component of a comprehensive MRSA colonization surveillance program. It  is not intended to diagnose MRSA infection nor to guide or monitor treatment for MRSA infections. Test performance is not FDA approved in patients less than 27 years old. Performed at [facility], [place] [address] [place], [place] [address]      Labs: Basic Metabolic Panel: Recent Labs  Lab 04/05/21 0036 04/05/21 0606 04/05/21 1002 04/06/21 0251 04/06/21 0617 04/06/21 1300 04/06/21 1619 04/06/21 2040 04/07/21 0039 04/07/21 0756 04/07/21 1232  NA 120* 120*   < > 135 137   < > 131* 127* 124* 126* 134*  K 2.9* 3.8  --  4.2 4.3  --   --   --  3.4* 3.8 3.9  CL 87* 87*  --  107 108  --   --   --  93* 93* 101  CO2 23 22  --  22 23  --   --   --  _0 GLUCOSE 198* 144*  --  109* 107*  --   --   --  104* 106* 130*  BUN 7 5*  --  6 6  --   --   --  _1 CREATININE 0.88 0.70  --  0.79 0.78  --   --   --  0.68 0.70 0.73  CALCIUM 8.0* 7.8*  --  8.6* 8.6*  --   --   --  8.3* 8.6* 9.6  MG 1.7 1.3*  --  2.4  --   --   --   --   --   --   --   PHOS  --   --   --   --   --   --   --   --   --  3.2  --    < > = values in this interval not displayed.   Liver Function Tests: Recent Labs  Lab 04/05/21 0036 04/06/21 0617 04/07/21 0756  AST 31 25  --   ALT 20 19  --   ALKPHOS 57 63  --   BILITOT 1.0 0.8  --   PROT 6.4* 6.2*  --   ALBUMIN 4.0 3.9 3.9   No results for input(s): LIPASE, AMYLASE in the last 168 hours. No results for input(s): AMMONIA in the last 168 hours. CBC: Recent Labs  Lab 04/05/21 0036 04/05/21 0350  WBC 8.8 12.1*  HGB 13.7 13.8  HCT 38.0* 37.8*  MCV 89.6 88.1  PLT 219 213   Cardiac Enzymes: Recent Labs  Lab  04/05/21 0036  CKTOTAL 139   BNP: BNP (last 3 results) No results for input(s): BNP in the last 8760 hours.  ProBNP (last 3 results) No results for input(s): PROBNP in the last 8760 hours.  CBG: Recent Labs  Lab 04/05/21 0042  GLUCAP 218*       Signed:  Florencia Reasons MD, PhD, FACP  [facility] 04/07/2021, 3:08 PM

## 2021-04-07 NOTE — TOC Initial Note (Signed)
Transition of Care [facility]) - Initial/Assessment Note    Patient Details  Name: Rick Santiago MRN: [id number] Date of Birth: 02-13-1976  [facility] CM/SW Contact:    Leeroy Cha, RN Phone Number: 04/07/2021, 7:50 AM  Clinical Narrative:                  45 year old male with a history of psoriatic arthritis for which he is on methotrexate weekly, last dose on 9/24.  Patient reported in ED that he was taking bowel prep for screening colonoscopy, scheduled for 9/26 (GI, Dr. [name]).  Patient drank first half of bowel prep around 5 PM.  By 7-7:30 PM he started having bowel movements, had 5-6 loose stools.  He was starting to take second half of the bowel prep when he became very lightheaded and weak.  He stated that his muscles ached all over, became dizzy but did not pass out.  After arrival in ED, patient felt some nausea but no vomiting. In ED, patient continued to feel very weak and lightheaded.  He was found to be severely hyponatremic with a sodium of 120, potassium 2.9.  Patient was placed on IV fluid hydration, potassium replacement. TOC PLAN OF CARE: To return to home with self care Following for progression\ Na=124, k+=3.4, iv ns Expected Discharge Plan: Home/Self Care Barriers to Discharge: Continued Medical Work up   Patient Goals and CMS Choice Patient states their goals for this hospitalization and ongoing recovery are:: to go home CMS Medicare.gov Compare Post Acute Care list provided to:: Patient    Expected Discharge Plan and Services Expected Discharge Plan: Home/Self Care   Discharge Planning Services: CM Consult   Living arrangements for the past 2 months: Single Family Home                                      Prior Living Arrangements/Services Living arrangements for the past 2 months: Single Family Home Lives with:: Spouse Patient language and need for interpreter reviewed:: Yes Do you feel safe going back to the place where you  live?: Yes            Criminal Activity/Legal Involvement Pertinent to Current Situation/Hospitalization: No - Comment as needed  Activities of Daily Living Home Assistive Devices/Equipment: None ADL Screening (condition at time of admission) Patient's cognitive ability adequate to safely complete daily activities?: No (secondary to confusion) Is the patient deaf or have difficulty hearing?: No Does the patient have difficulty seeing, even when wearing glasses/contacts?: No Does the patient have difficulty concentrating, remembering, or making decisions?: Yes Patient able to express need for assistance with ADLs?: No Does the patient have difficulty dressing or bathing?: Yes Independently performs ADLs?: No (secondary to confusion and weakness) Communication: Independent Dressing (OT): Needs assistance Is this a change from baseline?: Change from baseline, expected to last <3days Grooming: Needs assistance Is this a change from baseline?: Change from baseline, expected to last <3 days Feeding: Needs assistance Is this a change from baseline?: Change from baseline, expected to last <3 days Bathing: Needs assistance Is this a change from baseline?: Change from baseline, expected to last >3 days Toileting: Needs assistance Is this a change from baseline?: Change from baseline, expected to last <3 days In/Out Bed: Needs assistance Is this a change from baseline?: Change from baseline, expected to last <3 days Walks in Home: Needs assistance Is this a change from  baseline?: Change from baseline, expected to last <3 days Does the patient have difficulty walking or climbing stairs?: Yes (secondary to weakness and confusion) Weakness of Legs: Both Weakness of Arms/Hands: None  Permission Sought/Granted                  Emotional Assessment Appearance:: Appears stated age     Orientation: : Oriented to Place, Oriented to Self, Oriented to  Time, Oriented to Situation Alcohol /  Substance Use: Not Applicable Psych Involvement: No (comment)  Admission diagnosis:  Hypokalemia [E87.6] Hyponatremia [E87.1] Patient Active Problem List   Diagnosis Date Noted   Hyponatremia 04/05/2021   Hypokalemia 04/05/2021   Hypocalcemia 04/05/2021   Plantar fasciitis 09/29/2016   Psoriasis 09/29/2016   High risk medication use 09/29/2016   Psoriatic arthritis ([place]) 09/14/2016   FHx: hemochromatosis 09/14/2016   Encounter for methotrexate monitoring 09/14/2016   Family history of diabetes mellitus in mother 09/14/2016   PCP:  [name], MD Pharmacy:   [facility] [place], [place] - [address] [place] [address] Phone: [phone] Fax: [phone]     Social Determinants of Health ([place]) Interventions    Readmission Risk Interventions No flowsheet data found.

## 2021-04-07 NOTE — Progress Notes (Signed)
This RN went over discharge instructions with patient and his wife. They understood importance of follow up appointments and cause of hospitalization. They stated that pharmacy listed was correct. Peripherally IV was removed and patient safely escorted safely out of hospital.

## 2021-04-16 ENCOUNTER — Other Ambulatory Visit: Payer: Self-pay | Admitting: Rheumatology

## 2021-04-16 NOTE — Telephone Encounter (Signed)
Next Visit: 06/22/2021   Last Visit: 01/21/2021   Last Fill: 01/25/2021  DX:  Psoriatic arthritis   Current Dose per office note 01/21/2021: Methotrexate 2.5 mg 5 tablets every 7 days   Labs: 04/07/2021 Sodium 134, Glucose 130 04/06/2021 Total Protein 6.2, 04/05/2021 WBC 12.1, HCT 37.8, MCHC 36.5   Okay to refill MTX?

## 2021-04-22 ENCOUNTER — Ambulatory Visit: Payer: BC Managed Care – PPO | Attending: Family

## 2021-04-22 DIAGNOSIS — Z23 Encounter for immunization: Secondary | ICD-10-CM

## 2021-04-22 NOTE — Progress Notes (Signed)
   Covid-19 Vaccination Clinic  Name:  Rick Santiago    MRN: [id number] DOB: 10-16-1975  04/22/2021  [name] was observed post Covid-19 immunization for 15 minutes without incident. He was provided with Vaccine Information Sheet and instruction to access the V-Safe system.   [name] was instructed to call 911 with any severe reactions post vaccine: Difficulty breathing  Swelling of face and throat  A fast heartbeat  A bad rash all over body  Dizziness and weakness

## 2021-06-11 NOTE — Progress Notes (Signed)
Office Visit Note  Patient: Rick Santiago             Date of Birth: [birth year]           MRN: [id number]             PCP: Almedia Balls, NP Referring: [name], MD Visit Date: 06/24/2021 Occupation: @GUAROCC @  Subjective:  Medication monitoring   History of Present Illness: Rick Santiago is a 45 y.o. male with history of psoriatic arthritis.  He is currently taking methotrexate 5 tablets by mouth once weekly and folic acid 1 mg (4-5 days per week).  He continues to tolerate methotrexate without any side effects.  He denies any signs or symptoms of a flare.  He has had intermittent discomfort in the left knee joint which she attributes to colder rainy weather recently.  He denies any joint swelling.  He is not experiencing any mechanical symptoms.  He describes the sensation in his left knee as an aching pain which has been short-lived and self resolving.  He has not needed to take any over-the-counter products for pain relief.  He denies any Achilles tendinitis or plantar fasciitis.  He denies any SI joint discomfort.  He continues to have scattered patches of psoriasis.  He was evaluated by his dermatologist today.  He will be trying a new topical agent, vtama, to see if will clear the chronic patches. He denies any recent infections.  He has had the flu vaccine, pneumonia vaccine, and bivalent covid vaccine.   He was hospitalized on 04/04/21 after developing hyponatremia and hypokalemia after a bowel prep for a colonoscopy.  He was in the ICU for 3 days.  He is not currently on a fluid restriction.   Activities of Daily Living:  Patient reports morning stiffness for 0 minutes.   Patient Denies nocturnal pain.  Difficulty dressing/grooming: Denies Difficulty climbing stairs: Denies Difficulty getting out of chair: Denies Difficulty using hands for taps, buttons, cutlery, and/or writing: Denies  Review of Systems  Constitutional:  Negative for fatigue.  HENT:  Negative for  mouth sores, mouth dryness and nose dryness.   Eyes:  Negative for pain, itching and dryness.  Respiratory:  Negative for shortness of breath and difficulty breathing.   Cardiovascular:  Negative for chest pain and palpitations.  Gastrointestinal:  Negative for blood in stool, constipation and diarrhea.  Endocrine: Negative for increased urination.  Genitourinary:  Negative for difficulty urinating.  Musculoskeletal:  Negative for joint pain, joint pain, joint swelling, myalgias, morning stiffness, muscle tenderness and myalgias.  Skin:  Positive for rash. Negative for color change.  Allergic/Immunologic: Negative for susceptible to infections.  Neurological:  Negative for dizziness, numbness, headaches, memory loss and weakness.  Hematological:  Negative for bruising/bleeding tendency.  Psychiatric/Behavioral:  Negative for confusion.    PMFS History:  Patient Active Problem List   Diagnosis Date Noted   Hyponatremia 04/05/2021   Hypokalemia 04/05/2021   Hypocalcemia 04/05/2021   Plantar fasciitis 09/29/2016   Psoriasis 09/29/2016   High risk medication use 09/29/2016   Psoriatic arthritis ([place]) 09/14/2016   FHx: hemochromatosis 09/14/2016   Encounter for methotrexate monitoring 09/14/2016   Family history of diabetes mellitus in mother 09/14/2016    Past Medical History:  Diagnosis Date   Arthritis    Psoriasis     Family History  Problem Relation Age of Onset   Diabetes Mother    Heart disease Father    Irritable bowel syndrome Daughter    History  reviewed. No pertinent surgical history. Social History   Social History Narrative   Not on file   Immunization History  Administered Date(s) Administered   HPV 9-valent 05/08/2019, 06/11/2019, 11/07/2019   Hpv-Unspecified 05/08/2019, 06/11/2019, 11/07/2019   Influenza Inj Mdck Quad Pf 03/19/2019   Influenza,inj,Quad PF,6+ Mos 03/27/2017, 05/07/2018   Influenza-Unspecified 04/10/2016   Moderna Covid-19 Vaccine Bivalent  Booster 25yr & up 04/22/2021   Moderna SARS-COV2 Booster Vaccination 05/13/2020   Moderna Sars-Covid-2 Vaccination 09/19/2019, 10/22/2019   Tdap 06/21/2018     Objective: Vital Signs: BP 136/90 (BP Location: Right Arm, Patient Position: Sitting, Cuff Size: Normal)   Pulse 66   Ht 5' 7"  (1.702 m)   Wt 144 lb (65.3 kg)   BMI 22.55 kg/m    Physical Exam Vitals and nursing note reviewed.  Constitutional:      Appearance: He is well-developed.  HENT:     Head: Normocephalic and atraumatic.  Eyes:     Conjunctiva/sclera: Conjunctivae normal.     Pupils: Pupils are equal, round, and reactive to light.  Pulmonary:     Effort: Pulmonary effort is normal.  Abdominal:     Palpations: Abdomen is soft.  Musculoskeletal:     Cervical back: Normal range of motion and neck supple.  Skin:    General: Skin is warm and dry.     Capillary Refill: Capillary refill takes less than 2 seconds.  Neurological:     Mental Status: He is alert and oriented to person, place, and time.  Psychiatric:        Behavior: Behavior normal.     Musculoskeletal Exam: C-spine, thoracic spine, lumbar spine have good range of motion with no discomfort.  No midline spinal tenderness or SI joint tenderness.  Shoulder joints, elbow joints, wrist joints, MCPs, PIPs, DIPs have good range of motion with no synovitis.  PIP and DIP prominence. Complete fist formation bilaterally.  Hip joints have good range of motion with no discomfort.  Knee joints have good range of motion with no warmth or effusion.  Ankle joints have good range of motion with no tenderness or joint swelling.  No evidence of Achilles tendinitis.  CDAI Exam: CDAI Score: -- Patient Global: --; Provider Global: -- Swollen: --; Tender: -- Joint Exam 06/24/2021   No joint exam has been documented for this visit   There is currently no information documented on the homunculus. Go to the Rheumatology activity and complete the homunculus joint  exam.  Investigation: No additional findings.  Imaging: No results found.  Recent Labs: Lab Results  Component Value Date   WBC 12.1 (H) 04/05/2021   HGB 13.8 04/05/2021   PLT 213 04/05/2021   NA 134 (L) 04/07/2021   K 3.9 04/07/2021   CL 101 04/07/2021   CO2 25 04/07/2021   GLUCOSE 130 (H) 04/07/2021   BUN 9 04/07/2021   CREATININE 0.73 04/07/2021   BILITOT 0.8 04/06/2021   ALKPHOS 63 04/06/2021   AST 25 04/06/2021   ALT 19 04/06/2021   PROT 6.2 (L) 04/06/2021   ALBUMIN 3.9 04/07/2021   CALCIUM 9.6 04/07/2021   GFRAA 119 11/17/2020    Speciality Comments: No specialty comments available.  Procedures:  No procedures performed Allergies: Patient has no known allergies.   Assessment / Plan:     Visit Diagnoses: Psoriatic arthritis ([facility]: He has no synovitis or dactylitis on examination.  He has not had any signs or symptoms of a psoriatic arthritis flare recently.  He has clinically been  doing well taking methotrexate 5 tablets by mouth once weekly and folic acid 1 mg daily.  He continues to tolerate methotrexate without any side effects and has not missed any doses recently.  He has been experiencing intermittent discomfort in the left knee joint which has been short-lived and self resolving.  On examination he has good range of motion of the left knee joint with no warmth or effusion.  He declined updated x-rays of the left knee today.  Discussed the importance of lower extremity muscle strengthening.  He was advised to notify us if the discomfort persists or worsens.  He will remain on methotrexate and folic acid as prescribed.  A refill of methotrexate was sent to the pharmacy today.  Discussed the importance of remaining compliant taking folic acid on a daily basis.  He will follow-up in the office in 5 months.  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  He was evaluated by his dermatologist today.  He will be trying a new topical agent, vtama  1% cream for management of plaque psoriasis.   High risk medication use - Methotrexate 2.5 mg 5 tablets by mouth every 7 days and folic acid 1 mg daily.  CBC and CMP were within normal limits on 06/17/2021.  Results were reviewed with the patient today in the office.  His next lab work will be due in March and every 3 months to monitor for drug toxicity.  Standing orders for CBC and CMP remain in place. He has not had any recent infections.  He received the annual influenza vaccine, pneumonia vaccine, and bi-valent COVID-vaccine.  Discussed that he is eligible for the Shingrix vaccination since he is on methotrexate. Discussed the importance of holding methotrexate if he develops signs or symptoms of an infection and to resume once the infection has completely cleared.  Lab results from 06/17/2021 were reviewed with the patient today in the office: CBC within normal limits, CMP within normal limits, iron panel normal, PSA 0.95, magnesium 2.2, LDL 114, rest of lipid panel within normal limits.   Plantar fasciitis - H/o:followed by a podiatrist closely for several years, and he has been wearing custom made orthotics for the past 10 years.  His symptoms have been tolerable.  He wears proper fitting shoes.  Hyponatremia: Patient was hospitalized from 9/26-9/28/22  for management of hyponatremia secondary to a bowel prep for a routine colonoscopy.  He was found to be hyponatremic with sodium of 120 and hypokalemic with potassium of 2.9 in the ED.  He was admitted for further management and nephrology was consulted. He was discharge once his sodium reached an acceptable status and his neurologic status was stable. Sodium was 142 on 06/17/2021.  He is not currently on any fluid restriction and is following a regular diet.   FHx: hemochromatosis - Mother-Iron is checked yearly by PCP.   Iron panel was within normal limits on 06/17/2021.  Family history of psoriasis  Orders: No orders of the defined types were  placed in this encounter.  Meds ordered this encounter  Medications   methotrexate (RHEUMATREX) 2.5 MG tablet    Sig: Take 5 tablets by mouth once weekly. Caution:Chemotherapy. Protect from light.    Dispense:  60 tablet    Refill:  0    Follow-Up Instructions: Return in about 5 months (around 11/22/2021) for Psoriatic arthritis.   Ofilia Neas, PA-C  Note - This record has been created using Dragon software.  Chart creation errors have  been sought, but may not always  have been located. Such creation errors do not reflect on  the standard of medical care.

## 2021-06-18 LAB — COLOGUARD: COLOGUARD: NEGATIVE

## 2021-06-22 ENCOUNTER — Ambulatory Visit: Payer: BC Managed Care – PPO | Admitting: Physician Assistant

## 2021-06-24 ENCOUNTER — Other Ambulatory Visit: Payer: Self-pay

## 2021-06-24 ENCOUNTER — Encounter: Payer: Self-pay | Admitting: Physician Assistant

## 2021-06-24 ENCOUNTER — Ambulatory Visit: Payer: BC Managed Care – PPO | Admitting: Physician Assistant

## 2021-06-24 VITALS — BP 136/90 | HR 66 | Ht 67.0 in | Wt 144.0 lb

## 2021-06-24 DIAGNOSIS — G8929 Other chronic pain: Secondary | ICD-10-CM

## 2021-06-24 DIAGNOSIS — Z8349 Family history of other endocrine, nutritional and metabolic diseases: Secondary | ICD-10-CM

## 2021-06-24 DIAGNOSIS — L4050 Arthropathic psoriasis, unspecified: Secondary | ICD-10-CM

## 2021-06-24 DIAGNOSIS — M722 Plantar fascial fibromatosis: Secondary | ICD-10-CM | POA: Diagnosis not present

## 2021-06-24 DIAGNOSIS — L409 Psoriasis, unspecified: Secondary | ICD-10-CM

## 2021-06-24 DIAGNOSIS — Z79899 Other long term (current) drug therapy: Secondary | ICD-10-CM | POA: Diagnosis not present

## 2021-06-24 DIAGNOSIS — E871 Hypo-osmolality and hyponatremia: Secondary | ICD-10-CM

## 2021-06-24 DIAGNOSIS — Z840 Family history of diseases of the skin and subcutaneous tissue: Secondary | ICD-10-CM

## 2021-06-24 MED ORDER — METHOTREXATE 2.5 MG PO TABS: ORAL_TABLET | ORAL | 0 refills | Status: DC

## 2021-06-24 NOTE — Patient Instructions (Signed)
Standing Labs We placed an order today for your standing lab work.   Please have your standing labs drawn in March and every 3 months    If possible, please have your labs drawn 2 weeks prior to your appointment so that the provider can discuss your results at your appointment.  Please note that you may see your imaging and lab results in [place] before we have reviewed them. We may be awaiting multiple results to interpret others before contacting you. Please allow our office up to 72 hours to thoroughly review all of the results before contacting the office for clarification of your results.  We have open lab daily: Monday through Thursday from 1:30-4:30 PM and Friday from 1:30-4:00 PM at the office of Dr. Bo Merino, [facility].   Please be advised, all patients with office appointments requiring lab work will take precedent over walk-in lab work.  If possible, please come for your lab work on Monday and Friday afternoons, as you may experience shorter wait times. The office is located at [address] [place], [place], [place] [address] No appointment is necessary.   Labs are drawn by [facility]. Please bring your co-pay at the time of your lab draw.  You may receive a bill from [place] [facility] for your lab work.  If you wish to have your labs drawn at another location, please call the office 24 hours in advance to send orders.  If you have any questions regarding directions or hours of operation,  please call [phone].   As a reminder, please drink plenty of water prior to coming for your lab work. Thanks!

## 2021-09-17 ENCOUNTER — Other Ambulatory Visit: Payer: Self-pay | Admitting: Physician Assistant

## 2021-09-17 ENCOUNTER — Other Ambulatory Visit: Payer: Self-pay | Admitting: *Deleted

## 2021-09-17 DIAGNOSIS — Z79899 Other long term (current) drug therapy: Secondary | ICD-10-CM

## 2021-09-17 NOTE — Telephone Encounter (Signed)
Next Visit: 11/25/2021 ? ?Last Visit: 06/24/2021 ? ?Last Fill: 06/24/2021 ? ?DX: Psoriatic arthritis ? ?Current Dose per office note on 06/24/2021: Methotrexate 2.5 mg 5 tablets by mouth every 7 days ? ?Labs: 06/17/2021 CBC and CMP were within normal limits  ? ?Advised patient he is due to update labs, patient verbalized understanding and will come by today for labs. Lab orders placed.  ? ?Okay to refill methotrexate?  ?

## 2021-09-18 LAB — CBC WITH DIFFERENTIAL/PLATELET
Absolute Monocytes: 409 cells/uL (ref 200–950)
Basophils Absolute: 50 cells/uL (ref 0–200)
Basophils Relative: 0.9 %
Eosinophils Absolute: 67 cells/uL (ref 15–500)
Eosinophils Relative: 1.2 %
HCT: 46.7 % (ref 38.5–50.0)
Hemoglobin: 16.1 g/dL (ref 13.2–17.1)
Lymphs Abs: 1674 cells/uL (ref 850–3900)
MCH: 32 pg (ref 27.0–33.0)
MCHC: 34.5 g/dL (ref 32.0–36.0)
MCV: 92.8 fL (ref 80.0–100.0)
MPV: 10.4 fL (ref 7.5–12.5)
Monocytes Relative: 7.3 %
Neutro Abs: 3399 cells/uL (ref 1500–7800)
Neutrophils Relative %: 60.7 %
Platelets: 229 10*3/uL (ref 140–400)
RBC: 5.03 10*6/uL (ref 4.20–5.80)
RDW: 12.9 % (ref 11.0–15.0)
Total Lymphocyte: 29.9 %
WBC: 5.6 10*3/uL (ref 3.8–10.8)

## 2021-09-18 LAB — COMPLETE METABOLIC PANEL WITH GFR
AG Ratio: 2 (calc) (ref 1.0–2.5)
ALT: 18 U/L (ref 9–46)
AST: 20 U/L (ref 10–40)
Albumin: 4.5 g/dL (ref 3.6–5.1)
Alkaline phosphatase (APISO): 73 U/L (ref 36–130)
BUN: 10 mg/dL (ref 7–25)
CO2: 30 mmol/L (ref 20–32)
Calcium: 9.4 mg/dL (ref 8.6–10.3)
Chloride: 102 mmol/L (ref 98–110)
Creat: 0.91 mg/dL (ref 0.60–1.29)
Globulin: 2.2 g/dL (calc) (ref 1.9–3.7)
Glucose, Bld: 67 mg/dL (ref 65–99)
Potassium: 4.5 mmol/L (ref 3.5–5.3)
Sodium: 138 mmol/L (ref 135–146)
Total Bilirubin: 0.7 mg/dL (ref 0.2–1.2)
Total Protein: 6.7 g/dL (ref 6.1–8.1)
eGFR: 105 mL/min/{1.73_m2} (ref >=60)

## 2021-09-20 NOTE — Progress Notes (Signed)
CBC and CMP WNL

## 2021-10-18 ENCOUNTER — Encounter (HOSPITAL_COMMUNITY): Payer: Self-pay

## 2021-10-18 ENCOUNTER — Other Ambulatory Visit: Payer: Self-pay

## 2021-10-18 ENCOUNTER — Emergency Department (HOSPITAL_COMMUNITY)
Admission: EM | Admit: 2021-10-18 | Discharge: 2021-10-19 | Disposition: A | Discharge status: Home / Self Care | Payer: BC Managed Care – PPO | Attending: Emergency Medicine | Admitting: Emergency Medicine

## 2021-10-18 DIAGNOSIS — K529 Noninfective gastroenteritis and colitis, unspecified: Secondary | ICD-10-CM | POA: Diagnosis not present

## 2021-10-18 DIAGNOSIS — R197 Diarrhea, unspecified: Secondary | ICD-10-CM | POA: Diagnosis present

## 2021-10-18 LAB — CBC WITH DIFFERENTIAL/PLATELET
Abs Immature Granulocytes: 0.01 10*3/uL (ref 0.00–0.07)
Basophils Absolute: 0 10*3/uL (ref 0.0–0.1)
Basophils Relative: 0 %
Eosinophils Absolute: 0 10*3/uL (ref 0.0–0.5)
Eosinophils Relative: 0 %
HCT: 42.5 % (ref 39.0–52.0)
Hemoglobin: 15.2 g/dL (ref 13.0–17.0)
Immature Granulocytes: 0 %
Lymphocytes Relative: 5 %
Lymphs Abs: 0.4 10*3/uL — ABNORMAL LOW (ref 0.7–4.0)
MCH: 32.3 pg (ref 26.0–34.0)
MCHC: 35.8 g/dL (ref 30.0–36.0)
MCV: 90.4 fL (ref 80.0–100.0)
Monocytes Absolute: 0.3 10*3/uL (ref 0.1–1.0)
Monocytes Relative: 3 %
Neutro Abs: 7.2 10*3/uL (ref 1.7–7.7)
Neutrophils Relative %: 92 %
Platelets: 210 10*3/uL (ref 150–400)
RBC: 4.7 MIL/uL (ref 4.22–5.81)
RDW: 12.7 % (ref 11.5–15.5)
WBC: 7.9 10*3/uL (ref 4.0–10.5)
nRBC: 0 % (ref 0.0–0.2)

## 2021-10-18 LAB — COMPREHENSIVE METABOLIC PANEL
ALT: 20 U/L (ref 0–44)
AST: 23 U/L (ref 15–41)
Albumin: 4.3 g/dL (ref 3.5–5.0)
Alkaline Phosphatase: 70 U/L (ref 38–126)
Anion gap: 8 (ref 5–15)
BUN: 9 mg/dL (ref 6–20)
CO2: 25 mmol/L (ref 22–32)
Calcium: 8.7 mg/dL — ABNORMAL LOW (ref 8.9–10.3)
Chloride: 95 mmol/L — ABNORMAL LOW (ref 98–111)
Creatinine, Ser: 0.79 mg/dL (ref 0.61–1.24)
GFR, Estimated: >60 mL/min (ref >60)
Glucose, Bld: 134 mg/dL — ABNORMAL HIGH (ref 70–99)
Potassium: 3.4 mmol/L — ABNORMAL LOW (ref 3.5–5.1)
Sodium: 128 mmol/L — ABNORMAL LOW (ref 135–145)
Total Bilirubin: 1.6 mg/dL — ABNORMAL HIGH (ref 0.3–1.2)
Total Protein: 7.2 g/dL (ref 6.5–8.1)

## 2021-10-18 LAB — URINALYSIS, ROUTINE W REFLEX MICROSCOPIC
Bilirubin Urine: NEGATIVE
Glucose, UA: NEGATIVE mg/dL
Hgb urine dipstick: NEGATIVE
Ketones, ur: 20 mg/dL — AB
Leukocytes,Ua: NEGATIVE
Nitrite: NEGATIVE
Protein, ur: NEGATIVE mg/dL
Specific Gravity, Urine: 1.018 (ref 1.005–1.030)
pH: 8 (ref 5.0–8.0)

## 2021-10-18 MED ORDER — ONDANSETRON HCL 4 MG/2ML IJ SOLN
4.0000 mg | Freq: Once | INTRAMUSCULAR | Status: AC
  Administered 2021-10-18: 4 mg via INTRAVENOUS
  Filled 2021-10-18: qty 2

## 2021-10-18 MED ORDER — SODIUM CHLORIDE 0.9 % IV BOLUS
1000.0000 mL | Freq: Once | INTRAVENOUS | Status: AC
Start: 2021-10-18 — End: 2021-10-19
  Administered 2021-10-18: 1000 mL via INTRAVENOUS

## 2021-10-18 MED ORDER — METOCLOPRAMIDE HCL 5 MG/ML IJ SOLN
10.0000 mg | Freq: Once | INTRAMUSCULAR | Status: DC | PRN
Start: 2021-10-18 — End: 2021-10-19

## 2021-10-18 NOTE — ED Provider Notes (Signed)
? ?[facility] DEPT ?Provider Note: Georgena Spurling, MD, [place] ? ?CSN: [id number] ?MRN: [id number] ?ARRIVAL: 10/18/21 at 2051 ?ROOM: WA13/WA13 ? ? ?CHIEF COMPLAINT  ?N/V/D ? ? ?HISTORY OF PRESENT ILLNESS  ?10/18/21 10:47 PM ?Rick Santiago is a 46 y.o. male who woke up yesterday morning with diarrhea.  He had diarrhea throughout the day.  Because he has a history of hyponatremia (his sodium went down to 120 in September of last year after colonoscopy prep) he tried to stay hydrated with over-the-counter rehydration drinks.  Yesterday he had no appetite and he began to have nausea and vomiting.  He took a Zofran about 8 PM and has not vomited since but continues to be nauseated.  He has having some general malaise as well as a headache.  He has had abdominal cramping with this but not severe. ? ? ?Past Medical History:  ?Diagnosis Date  ? Arthritis   ? Psoriasis   ? ? ?History reviewed. No pertinent surgical history. ? ?Family History  ?Problem Relation Age of Onset  ? Diabetes Mother   ? Heart disease Father   ? Irritable bowel syndrome Daughter   ? ? ?Social History  ? ?Tobacco Use  ? Smoking status: Never  ? Smokeless tobacco: Never  ?Vaping Use  ? Vaping Use: Never used  ?Substance Use Topics  ? Alcohol use: No  ? Drug use: No  ? ? ?Prior to Admission medications   ?Medication Sig Start Date End Date Taking? Authorizing Provider  ?ondansetron (ZOFRAN-ODT) 8 MG disintegrating tablet Take 1 tablet (8 mg total) by mouth every 8 (eight) hours as needed for nausea or vomiting. 10/19/21  Yes Toi Stelly, MD  ?folic acid (FOLVITE) 1 MG tablet Take 1 tablet (1 mg total) by mouth daily. 11/03/20   Ofilia Neas, PA-C  ?methotrexate (RHEUMATREX) 2.5 MG tablet TAKE 5 TABLETS BY MOUTH ONCE WEEKLY. CAUTION:CHEMOTHERAPY. PROTECT FROM LIGHT. 09/17/21   Ofilia Neas, PA-C  ? ? ?Allergies ?Patient has no known allergies. ? ? ?REVIEW OF SYSTEMS  ?Negative except as noted here or in the History of Present Illness. ? ? ?PHYSICAL  EXAMINATION  ?Initial Vital Signs ?Blood pressure (!) 153/93, pulse 95, temperature 98.1 ?F (36.7 ?C), temperature source Oral, resp. rate 18, height 5' 7"  (1.702 m), weight 65.8 kg, SpO2 95 %. ? ?Examination ?General: Well-developed, well-nourished male in no acute distress; appearance consistent with age of record ?HENT: normocephalic; atraumatic ?Eyes: Normal appearance ?Neck: supple ?Heart: regular rate and rhythm ?Lungs: clear to auscultation bilaterally ?Abdomen: soft; nondistended; nontender; bowel sounds present ?Extremities: No deformity; full range of motion; pulses normal ?Neurologic: Awake, alert and oriented; motor function intact in all extremities and symmetric; no facial droop ?Skin: Warm and dry ?Psychiatric: Normal mood and affect ? ? ?RESULTS  ?Summary of this visit's results, reviewed and interpreted by myself: ? ? EKG Interpretation ? ?Date/Time:    ?Ventricular Rate:    ?PR Interval:    ?QRS Duration:   ?QT Interval:    ?QTC Calculation:   ?R Axis:     ?Text Interpretation:   ?  ? ?  ? ?Laboratory Studies: ?Results for orders placed or performed during the hospital encounter of 10/18/21 (from the past 24 hour(s))  ?CBC with Differential     Status: Abnormal  ? Collection Time: 10/18/21  9:02 PM  ?Result Value Ref Range  ? WBC 7.9 4.0 - 10.5 K/uL  ? RBC 4.70 4.22 - 5.81 MIL/uL  ? Hemoglobin 15.2 13.0 - 17.0  g/dL  ? HCT 42.5 39.0 - 52.0 %  ? MCV 90.4 80.0 - 100.0 fL  ? MCH 32.3 26.0 - 34.0 pg  ? MCHC 35.8 30.0 - 36.0 g/dL  ? RDW 12.7 11.5 - 15.5 %  ? Platelets 210 150 - 400 K/uL  ? nRBC 0.0 0.0 - 0.2 %  ? Neutrophils Relative % 92 %  ? Neutro Abs 7.2 1.7 - 7.7 K/uL  ? Lymphocytes Relative 5 %  ? Lymphs Abs 0.4 (L) 0.7 - 4.0 K/uL  ? Monocytes Relative 3 %  ? Monocytes Absolute 0.3 0.1 - 1.0 K/uL  ? Eosinophils Relative 0 %  ? Eosinophils Absolute 0.0 0.0 - 0.5 K/uL  ? Basophils Relative 0 %  ? Basophils Absolute 0.0 0.0 - 0.1 K/uL  ? Immature Granulocytes 0 %  ? Abs Immature Granulocytes 0.01 0.00  - 0.07 K/uL  ?Comprehensive metabolic panel     Status: Abnormal  ? Collection Time: 10/18/21  9:02 PM  ?Result Value Ref Range  ? Sodium 128 (L) 135 - 145 mmol/L  ? Potassium 3.4 (L) 3.5 - 5.1 mmol/L  ? Chloride 95 (L) 98 - 111 mmol/L  ? CO2 25 22 - 32 mmol/L  ? Glucose, Bld 134 (H) 70 - 99 mg/dL  ? BUN 9 6 - 20 mg/dL  ? Creatinine, Ser 0.79 0.61 - 1.24 mg/dL  ? Calcium 8.7 (L) 8.9 - 10.3 mg/dL  ? Total Protein 7.2 6.5 - 8.1 g/dL  ? Albumin 4.3 3.5 - 5.0 g/dL  ? AST 23 15 - 41 U/L  ? ALT 20 0 - 44 U/L  ? Alkaline Phosphatase 70 38 - 126 U/L  ? Total Bilirubin 1.6 (H) 0.3 - 1.2 mg/dL  ? GFR, Estimated >60 >60 mL/min  ? Anion gap 8 5 - 15  ?Urinalysis, Routine w reflex microscopic Urine, Clean Catch     Status: Abnormal  ? Collection Time: 10/18/21  9:42 PM  ?Result Value Ref Range  ? Color, Urine YELLOW YELLOW  ? APPearance CLEAR CLEAR  ? Specific Gravity, Urine 1.018 1.005 - 1.030  ? pH 8.0 5.0 - 8.0  ? Glucose, UA NEGATIVE NEGATIVE mg/dL  ? Hgb urine dipstick NEGATIVE NEGATIVE  ? Bilirubin Urine NEGATIVE NEGATIVE  ? Ketones, ur 20 (A) NEGATIVE mg/dL  ? Protein, ur NEGATIVE NEGATIVE mg/dL  ? Nitrite NEGATIVE NEGATIVE  ? Leukocytes,Ua NEGATIVE NEGATIVE  ?Basic metabolic panel     Status: Abnormal  ? Collection Time: 10/19/21  1:41 AM  ?Result Value Ref Range  ? Sodium 132 (L) 135 - 145 mmol/L  ? Potassium 3.6 3.5 - 5.1 mmol/L  ? Chloride 105 98 - 111 mmol/L  ? CO2 23 22 - 32 mmol/L  ? Glucose, Bld 111 (H) 70 - 99 mg/dL  ? BUN 8 6 - 20 mg/dL  ? Creatinine, Ser 0.75 0.61 - 1.24 mg/dL  ? Calcium 8.1 (L) 8.9 - 10.3 mg/dL  ? GFR, Estimated >60 >60 mL/min  ? Anion gap 4 (L) 5 - 15  ? ?Imaging Studies: ?No results found. ? ?ED COURSE and MDM  ?Nursing notes, initial and subsequent vitals signs, including pulse oximetry, reviewed and interpreted by myself. ? ?Vitals:  ? 10/18/21 2056 10/19/21 0000 10/19/21 0100 10/19/21 0245  ?BP: (!) 153/93 132/78 122/75 119/71  ?Pulse: 95 75 80 73  ?Resp: 18 18 18 18   ?Temp: 98.1 ?F (36.7  ?C)     ?TempSrc: Oral     ?SpO2: 95% 98% 98%  97%  ?Weight: 65.8 kg     ?Height: 5' 7"  (1.702 m)     ? ?Medications  ?metoCLOPramide (REGLAN) injection 10 mg (has no administration in time range)  ?ondansetron [facility]) injection 4 mg (4 mg Intravenous Given 10/18/21 2343)  ?sodium chloride 0.9 % bolus 1,000 mL (0 mLs Intravenous Stopped 10/19/21 0139)  ? ?4:26 AM ?Patient's sodium and chloride improved with hydration.  He is no longer nauseated and can keep himself hydrated orally. ? ? ?PROCEDURES  ?Procedures ? ? ?ED DIAGNOSES  ? ?  ICD-10-CM   ?1. Gastroenteritis  K52.9   ?  ? ? ? ?  ?Shanon Rosser, MD ?10/19/21 [phone] ? ?

## 2021-10-18 NOTE — ED Triage Notes (Signed)
Pt reports with diarrhea since yesterday that continues into today along with vomiting. Pt is concerned that he is dehydrated.  ?

## 2021-10-19 LAB — GASTROINTESTINAL PANEL BY PCR, STOOL (REPLACES STOOL CULTURE)

## 2021-10-19 LAB — BASIC METABOLIC PANEL
Anion gap: 4 — ABNORMAL LOW (ref 5–15)
BUN: 8 mg/dL (ref 6–20)
CO2: 23 mmol/L (ref 22–32)
Calcium: 8.1 mg/dL — ABNORMAL LOW (ref 8.9–10.3)
Chloride: 105 mmol/L (ref 98–111)
Creatinine, Ser: 0.75 mg/dL (ref 0.61–1.24)
GFR, Estimated: >60 mL/min (ref >60)
Glucose, Bld: 111 mg/dL — ABNORMAL HIGH (ref 70–99)
Potassium: 3.6 mmol/L (ref 3.5–5.1)
Sodium: 132 mmol/L — ABNORMAL LOW (ref 135–145)

## 2021-10-19 MED ORDER — ONDANSETRON 8 MG PO TBDP: 8.0000 mg | ORAL_TABLET | Freq: Three times a day (TID) | ORAL | 0 refills | Status: DC | PRN

## 2021-11-11 NOTE — Progress Notes (Deleted)
Office Visit Note  Patient: Rick Santiago             Date of Birth: 25-Apr-1976           MRN: [id number]             PCP: Almedia Balls, NP Referring: Almedia Balls, NP Visit Date: 11/25/2021 Occupation: @GUAROCC @  Subjective:  No chief complaint on file.   History of Present Illness: Rick Santiago is a 46 y.o. male ***   Activities of Daily Living:  Patient reports morning stiffness for *** {minute/hour:19697}.   Patient {ACTIONS;DENIES/REPORTS:21021675::"Denies"} nocturnal pain.  Difficulty dressing/grooming: {ACTIONS;DENIES/REPORTS:21021675::"Denies"} Difficulty climbing stairs: {ACTIONS;DENIES/REPORTS:21021675::"Denies"} Difficulty getting out of chair: {ACTIONS;DENIES/REPORTS:21021675::"Denies"} Difficulty using hands for taps, buttons, cutlery, and/or writing: {ACTIONS;DENIES/REPORTS:21021675::"Denies"}  No Rheumatology ROS completed.   PMFS History:  Patient Active Problem List   Diagnosis Date Noted   Hyponatremia 04/05/2021   Hypokalemia 04/05/2021   Hypocalcemia 04/05/2021   Plantar fasciitis 09/29/2016   Psoriasis 09/29/2016   High risk medication use 09/29/2016   Psoriatic arthritis ([place]) 09/14/2016   FHx: hemochromatosis 09/14/2016   Encounter for methotrexate monitoring 09/14/2016   Family history of diabetes mellitus in mother 09/14/2016    Past Medical History:  Diagnosis Date   Arthritis    Psoriasis     Family History  Problem Relation Age of Onset   Diabetes Mother    Heart disease Father    Irritable bowel syndrome Daughter    No past surgical history on file. Social History   Social History Narrative   Not on file   Immunization History  Administered Date(s) Administered   HPV 9-valent 05/08/2019, 06/11/2019, 11/07/2019   Hpv-Unspecified 05/08/2019, 06/11/2019, 11/07/2019   Influenza Inj Mdck Quad Pf 03/19/2019   Influenza,inj,Quad PF,6+ Mos 03/27/2017, 05/07/2018   Influenza-Unspecified 04/10/2016   Moderna Covid-19 Vaccine  Bivalent Booster 52yr & up 04/22/2021   Moderna SARS-COV2 Booster Vaccination 05/13/2020   Moderna Sars-Covid-2 Vaccination 09/19/2019, 10/22/2019   Tdap 06/21/2018     Objective: Vital Signs: There were no vitals taken for this visit.   Physical Exam   Musculoskeletal Exam: ***  CDAI Exam: CDAI Score: -- Patient Global: --; Provider Global: -- Swollen: --; Tender: -- Joint Exam 11/25/2021   No joint exam has been documented for this visit   There is currently no information documented on the homunculus. Go to the Rheumatology activity and complete the homunculus joint exam.  Investigation: No additional findings.  Imaging: No results found.  Recent Labs: Lab Results  Component Value Date   WBC 7.9 10/18/2021   HGB 15.2 10/18/2021   PLT 210 10/18/2021   NA 132 (L) 10/19/2021   K 3.6 10/19/2021   CL 105 10/19/2021   CO2 23 10/19/2021   GLUCOSE 111 (H) 10/19/2021   BUN 8 10/19/2021   CREATININE 0.75 10/19/2021   BILITOT 1.6 (H) 10/18/2021   ALKPHOS 70 10/18/2021   AST 23 10/18/2021   ALT 20 10/18/2021   PROT 7.2 10/18/2021   ALBUMIN 4.3 10/18/2021   CALCIUM 8.1 (L) 10/19/2021   GFRAA 119 11/17/2020    Speciality Comments: No specialty comments available.  Procedures:  No procedures performed Allergies: Patient has no known allergies.   Assessment / Plan:     Visit Diagnoses: No diagnosis found.  Orders: No orders of the defined types were placed in this encounter.  No orders of the defined types were placed in this encounter.   Face-to-face time spent with patient was *** minutes. Greater than 50%  of time was spent in counseling and coordination of care.  Follow-Up Instructions: No follow-ups on file.   Earnestine Mealing, CMA  Note - This record has been created using Editor, commissioning.  Chart creation errors have been sought, but may not always  have been located. Such creation errors do not reflect on  the standard of medical care.

## 2021-11-14 ENCOUNTER — Other Ambulatory Visit: Payer: Self-pay | Admitting: Physician Assistant

## 2021-11-15 NOTE — Telephone Encounter (Signed)
Next Visit: 11/25/2021 ?  ?Last Visit: 06/24/2021 ?  ?Last Fill: 11/03/2020 ? ?DX: Psoriatic arthritis ?  ?Current Dose per office note on 56/86/1683: folic acid 1 mg daily ? ?Okay to refill Folic Acid?  ?

## 2021-11-17 ENCOUNTER — Ambulatory Visit: Payer: BC Managed Care – PPO | Attending: Family

## 2021-11-17 DIAGNOSIS — Z23 Encounter for immunization: Secondary | ICD-10-CM

## 2021-11-18 NOTE — Progress Notes (Addendum)
? ?  [facility] ? ?Name:  Rick Santiago    ?MRN: [id number] ?DOB: 1976-02-12 ? ?11/18/2021 ? ?[name] was observed post Covid-19 immunization for 15 minutes without incident. He was provided with Vaccine Information Sheet and instruction to access the V-Safe system.  ? ?[name] was instructed to call 911 with any severe reactions post vaccine: ?Difficulty breathing  ?Swelling of face and throat  ?A fast heartbeat  ?A bad rash all over body  ?Dizziness and weakness  ? ?  ?

## 2021-11-22 NOTE — Progress Notes (Signed)
? ?Office Visit Note ? ?Patient: Rick Santiago             ?Date of Birth: [birth year]           ?MRN: [id number]             ?PCP: Almedia Balls, NP ?Referring: Almedia Balls, NP ?Visit Date: 11/24/2021 ?Occupation: @GUAROCC @ ? ?Subjective:  ?Medication monitoring  ? ?History of Present Illness: Rick Santiago is a 46 y.o. male with history of psoriatic arthritis.  He is taking methotrexate 5 tablets by mouth once weekly and folic acid 1 mg daily.  He continues to tolerate methotrexate without any side effects and has not missed any doses recently.  He denies any signs or symptoms of a psoriatic arthritis flare.  His symptoms of plantar fasciitis have been well controlled wearing proper fitting shoes.  He has not had any Achilles tendinitis.  He denies any SI joint discomfort.  He has not had any morning stiffness recently.  He denies any joint swelling.  The psoriasis on his elbows has almost completely resolved since starting on vtama topical cream.   ?He states that in April he had gastroenteritis requiring him to go to the emergency department for IV fluid repletion.  He denies any sequela.  He has not had any other recent infections. ? ? ? ?Activities of Daily Living:  ?Patient reports morning stiffness for 0 minutes.   ?Patient Denies nocturnal pain.  ?Difficulty dressing/grooming: Denies ?Difficulty climbing stairs: Denies ?Difficulty getting out of chair: Denies ?Difficulty using hands for taps, buttons, cutlery, and/or writing: Denies ? ?Review of Systems  ?Constitutional:  Negative for fatigue.  ?HENT:  Negative for mouth sores, mouth dryness and nose dryness.   ?Eyes:  Negative for pain, itching and dryness.  ?Respiratory:  Negative for shortness of breath and difficulty breathing.   ?Cardiovascular:  Negative for chest pain and palpitations.  ?Gastrointestinal:  Negative for blood in stool, constipation and diarrhea.  ?Endocrine: Negative for increased urination.  ?Genitourinary:  Negative for  difficulty urinating.  ?Musculoskeletal:  Negative for joint pain, joint pain, joint swelling, myalgias, morning stiffness, muscle tenderness and myalgias.  ?Skin:  Negative for color change, rash and redness.  ?Allergic/Immunologic: Negative for susceptible to infections.  ?Neurological:  Negative for dizziness, numbness, headaches, memory loss and weakness.  ?Hematological:  Negative for bruising/bleeding tendency.  ?Psychiatric/Behavioral:  Negative for confusion.   ? ?PMFS History:  ?Patient Active Problem List  ? Diagnosis Date Noted  ? Hyponatremia 04/05/2021  ? Hypokalemia 04/05/2021  ? Hypocalcemia 04/05/2021  ? Plantar fasciitis 09/29/2016  ? Psoriasis 09/29/2016  ? High risk medication use 09/29/2016  ? Psoriatic arthritis ([place]) 09/14/2016  ? FHx: hemochromatosis 09/14/2016  ? Encounter for methotrexate monitoring 09/14/2016  ? Family history of diabetes mellitus in mother 09/14/2016  ?  ?Past Medical History:  ?Diagnosis Date  ? Arthritis   ? Psoriasis   ?  ?Family History  ?Problem Relation Age of Onset  ? Diabetes Mother   ? Heart disease Father   ? Irritable bowel syndrome Daughter   ? ?Past Surgical History:  ?Procedure Laterality Date  ? SKIN BIOPSY    ? several places on back froze off and biopsied.  ? ?Social History  ? ?Social History Narrative  ? Not on file  ? ?Immunization History  ?Administered Date(s) Administered  ? HPV 9-valent 05/08/2019, 06/11/2019, 11/07/2019  ? Hpv-Unspecified 05/08/2019, 06/11/2019, 11/07/2019  ? Influenza Inj Mdck Quad Pf 03/19/2019  ? Influenza,inj,Quad PF,6+ Mos 03/27/2017,  05/07/2018  ? Influenza-Unspecified 04/10/2016  ? Moderna Covid-19 Vaccine Bivalent Booster 43yr & up 04/22/2021, 11/18/2021  ? Moderna SARS-COV2 Booster Vaccination 05/13/2020  ? Moderna Sars-Covid-2 Vaccination 09/19/2019, 10/22/2019  ? Tdap 06/21/2018  ?  ? ?Objective: ?Vital Signs: BP 116/73 (BP Location: Right Arm, Patient Position: Sitting, Cuff Size: Normal)   Pulse 89   Ht 5' 7"  (1.702  m)   Wt 141 lb 9.6 oz (64.2 kg)   BMI 22.18 kg/m?   ? ?Physical Exam ?Vitals and nursing note reviewed.  ?Constitutional:   ?   Appearance: He is well-developed.  ?HENT:  ?   Head: Normocephalic and atraumatic.  ?Eyes:  ?   Conjunctiva/sclera: Conjunctivae normal.  ?   Pupils: Pupils are equal, round, and reactive to light.  ?Cardiovascular:  ?   Rate and Rhythm: Normal rate and regular rhythm.  ?   Heart sounds: Normal heart sounds.  ?Pulmonary:  ?   Effort: Pulmonary effort is normal.  ?   Breath sounds: Normal breath sounds.  ?Abdominal:  ?   General: Bowel sounds are normal.  ?   Palpations: Abdomen is soft.  ?Musculoskeletal:  ?   Cervical back: Normal range of motion and neck supple.  ?Skin: ?   General: Skin is warm and dry.  ?   Capillary Refill: Capillary refill takes less than 2 seconds.  ?Neurological:  ?   Mental Status: He is alert and oriented to person, place, and time.  ?Psychiatric:     ?   Behavior: Behavior normal.  ?  ? ?Musculoskeletal Exam: C-spine, thoracic spine, lumbar spine have good range of motion.  No midline spinal tenderness or SI joint tenderness.  Shoulder joints, elbow joints, wrist joints, MCPs, PIPs, DIPs have good range of motion with no synovitis.  Complete fist formation bilaterally.  Hip joints have good range of motion with no groin pain.  Knee joints have good range of motion with no warmth or effusion.  Ankle joints have good range of motion with no tenderness or joint swelling.  No evidence of Achilles tendinitis. ? ?CDAI Exam: ?CDAI Score: -- ?Patient Global: --; Provider Global: -- ?Swollen: --; Tender: -- ?Joint Exam 11/24/2021  ? ?No joint exam has been documented for this visit  ? ?There is currently no information documented on the homunculus. Go to the Rheumatology activity and complete the homunculus joint exam. ? ?Investigation: ?No additional findings. ? ?Imaging: ?No results found. ? ?Recent Labs: ?Lab Results  ?Component Value Date  ? WBC 7.9 10/18/2021  ?  HGB 15.2 10/18/2021  ? PLT 210 10/18/2021  ? NA 132 (L) 10/19/2021  ? K 3.6 10/19/2021  ? CL 105 10/19/2021  ? CO2 23 10/19/2021  ? GLUCOSE 111 (H) 10/19/2021  ? BUN 8 10/19/2021  ? CREATININE 0.75 10/19/2021  ? BILITOT 1.6 (H) 10/18/2021  ? ALKPHOS 70 10/18/2021  ? AST 23 10/18/2021  ? ALT 20 10/18/2021  ? PROT 7.2 10/18/2021  ? ALBUMIN 4.3 10/18/2021  ? CALCIUM 8.1 (L) 10/19/2021  ? GFRAA 119 11/17/2020  ? ? ?Speciality Comments: No specialty comments available. ? ?Procedures:  ?No procedures performed ?Allergies: Patient has no known allergies.  ? ?Assessment / Plan:     ?Visit Diagnoses: Psoriatic arthritis ([place]: He has no synovitis or dactylitis on examination today.  He has not had any signs or symptoms of any sort of arthritis flare.  His arthritis remains well controlled taking methotrexate 5 tablets by mouth once weekly.  He has not  missed any doses of methotrexate recently.  He continues to tolerate methotrexate without any side effects.  He experiences some discomfort in both feet due to planter fasciitis but overall his symptoms have been tolerable using orthotics.  He has not had any Achilles tendinitis.  He has no SI joint tenderness upon palpation.  He has scattered patches of plaque psoriasis in both lower extremities.  He has a dime sized plaque of psoriasis on the extensor surface of his left elbow as well as surrounding hyperpigmentation.  He used vtama cream topically on a daily basis for 1 month which has almost completely cleared the psoriasis on the extensor surface of his elbows.  He has not yet tried using vtama for the plaques on his legs. ?Discussed alternative systemic treatment options including the use of Tremfya.  I discussed the efficacy and safety profile of tremfya today in detail.  Discussed that Tremfya can be used in combination with methotrexate in the future if needed. He would like to hold off on making any medication changes at this time.  He will remain on methotrexate as  monotherapy.  He will follow-up in the office in 5 months or sooner if needed. ? ?Psoriasis: Followed by [facility]. The plaques of psoriasis on the extensor surface of both elbows has almost completely cl

## 2021-11-24 ENCOUNTER — Ambulatory Visit: Payer: BC Managed Care – PPO | Admitting: Physician Assistant

## 2021-11-24 ENCOUNTER — Encounter: Payer: Self-pay | Admitting: Physician Assistant

## 2021-11-24 VITALS — BP 116/73 | HR 89 | Ht 67.0 in | Wt 141.6 lb

## 2021-11-24 DIAGNOSIS — L409 Psoriasis, unspecified: Secondary | ICD-10-CM

## 2021-11-24 DIAGNOSIS — Z8349 Family history of other endocrine, nutritional and metabolic diseases: Secondary | ICD-10-CM

## 2021-11-24 DIAGNOSIS — M722 Plantar fascial fibromatosis: Secondary | ICD-10-CM | POA: Diagnosis not present

## 2021-11-24 DIAGNOSIS — Z840 Family history of diseases of the skin and subcutaneous tissue: Secondary | ICD-10-CM

## 2021-11-24 DIAGNOSIS — Z79899 Other long term (current) drug therapy: Secondary | ICD-10-CM

## 2021-11-24 DIAGNOSIS — E871 Hypo-osmolality and hyponatremia: Secondary | ICD-10-CM

## 2021-11-24 DIAGNOSIS — L4050 Arthropathic psoriasis, unspecified: Secondary | ICD-10-CM | POA: Diagnosis not present

## 2021-11-24 NOTE — Patient Instructions (Signed)
Standing Labs ?We placed an order today for your standing lab work.  ? ?Please have your standing labs drawn in July and every 3 months  ? ?If possible, please have your labs drawn 2 weeks prior to your appointment so that the provider can discuss your results at your appointment. ? ?Please note that you may see your imaging and lab results in [place] before we have reviewed them. ?We may be awaiting multiple results to interpret others before contacting you. ?Please allow our office up to 72 hours to thoroughly review all of the results before contacting the office for clarification of your results. ? ?We have open lab daily: ?Monday through Thursday from 1:30-4:30 PM and Friday from 1:30-4:00 PM ?at the office of Dr. Bo Merino, [facility].   ?Please be advised, all patients with office appointments requiring lab work will take precedent over walk-in lab work.  ?If possible, please come for your lab work on Monday and Friday afternoons, as you may experience shorter wait times. ?The office is located at [address] [place], [place],  [address] ?No appointment is necessary.   ?Labs are drawn by [facility]. Please bring your co-pay at the time of your lab draw.  You may receive a bill from [place] for your lab work. ? ?Please note if you are on Hydroxychloroquine and and an order has been placed for a Hydroxychloroquine level, you will need to have it drawn 4 hours or more after your last dose. ? ?If you wish to have your labs drawn at another location, please call the office 24 hours in advance to send orders. ? ?If you have any questions regarding directions or hours of operation,  ?please call [phone].   ?As a reminder, please drink plenty of water prior to coming for your lab work. Thanks! ? ?

## 2021-11-25 ENCOUNTER — Ambulatory Visit: Payer: BC Managed Care – PPO | Admitting: Physician Assistant

## 2021-11-25 DIAGNOSIS — Z8349 Family history of other endocrine, nutritional and metabolic diseases: Secondary | ICD-10-CM

## 2021-11-25 DIAGNOSIS — Z840 Family history of diseases of the skin and subcutaneous tissue: Secondary | ICD-10-CM

## 2021-11-25 DIAGNOSIS — Z79899 Other long term (current) drug therapy: Secondary | ICD-10-CM

## 2021-11-25 DIAGNOSIS — M722 Plantar fascial fibromatosis: Secondary | ICD-10-CM

## 2021-11-25 DIAGNOSIS — L409 Psoriasis, unspecified: Secondary | ICD-10-CM

## 2021-11-25 DIAGNOSIS — E871 Hypo-osmolality and hyponatremia: Secondary | ICD-10-CM

## 2021-11-25 DIAGNOSIS — L4050 Arthropathic psoriasis, unspecified: Secondary | ICD-10-CM

## 2021-12-08 ENCOUNTER — Other Ambulatory Visit: Payer: Self-pay | Admitting: Physician Assistant

## 2021-12-08 NOTE — Telephone Encounter (Signed)
Next Visit: 04/28/2022  Last Visit: 11/24/2021  Last Fill: 09/17/2021  DX: Psoriatic arthritis   Current Dose per office note 11/24/2021: Methotrexate 2.5 mg 5 tablets by mouth every 7 days   Labs: 10/18/2021 Sodium 132, Glucose 111, Calcium 8.1, Anion Gap 4, Lymphs Abs 0.4  Okay to refill MTX?

## 2022-03-07 ENCOUNTER — Other Ambulatory Visit: Payer: Self-pay | Admitting: Physician Assistant

## 2022-03-07 NOTE — Telephone Encounter (Signed)
Next Visit: 04/28/2022   Last Visit: 11/24/2021   Last Fill: 12/08/2021   DX: Psoriatic arthritis    Current Dose per office note 11/24/2021: Methotrexate 2.5 mg 5 tablets by mouth every 7 days    Labs: 10/18/2021 Sodium 132, Glucose 111, Calcium 8.1, Anion Gap 4, Lymphs Abs 0.4  Patient advised he is due to update labs. Patient will come in to update labs this week.    Okay to refill MTX?

## 2022-03-08 ENCOUNTER — Other Ambulatory Visit: Payer: Self-pay

## 2022-03-08 DIAGNOSIS — Z79899 Other long term (current) drug therapy: Secondary | ICD-10-CM

## 2022-03-09 LAB — COMPLETE METABOLIC PANEL WITH GFR
AG Ratio: 2.1 (calc) (ref 1.0–2.5)
ALT: 17 U/L (ref 9–46)
AST: 20 U/L (ref 10–40)
Albumin: 4.6 g/dL (ref 3.6–5.1)
Alkaline phosphatase (APISO): 65 U/L (ref 36–130)
BUN: 11 mg/dL (ref 7–25)
CO2: 28 mmol/L (ref 20–32)
Calcium: 9.3 mg/dL (ref 8.6–10.3)
Chloride: 103 mmol/L (ref 98–110)
Creat: 0.96 mg/dL (ref 0.60–1.29)
Globulin: 2.2 g/dL (calc) (ref 1.9–3.7)
Glucose, Bld: 71 mg/dL (ref 65–99)
Potassium: 4.5 mmol/L (ref 3.5–5.3)
Sodium: 138 mmol/L (ref 135–146)
Total Bilirubin: 0.8 mg/dL (ref 0.2–1.2)
Total Protein: 6.8 g/dL (ref 6.1–8.1)
eGFR: 98 mL/min/{1.73_m2} (ref >=60)

## 2022-03-09 LAB — CBC WITH DIFFERENTIAL/PLATELET
Absolute Monocytes: 428 cells/uL (ref 200–950)
Basophils Absolute: 63 cells/uL (ref 0–200)
Basophils Relative: 1 %
Eosinophils Absolute: 63 cells/uL (ref 15–500)
Eosinophils Relative: 1 %
HCT: 45.6 % (ref 38.5–50.0)
Hemoglobin: 15.8 g/dL (ref 13.2–17.1)
Lymphs Abs: 1455 cells/uL (ref 850–3900)
MCH: 32.4 pg (ref 27.0–33.0)
MCHC: 34.6 g/dL (ref 32.0–36.0)
MCV: 93.6 fL (ref 80.0–100.0)
MPV: 10.2 fL (ref 7.5–12.5)
Monocytes Relative: 6.8 %
Neutro Abs: 4290 cells/uL (ref 1500–7800)
Neutrophils Relative %: 68.1 %
Platelets: 240 10*3/uL (ref 140–400)
RBC: 4.87 10*6/uL (ref 4.20–5.80)
RDW: 12.8 % (ref 11.0–15.0)
Total Lymphocyte: 23.1 %
WBC: 6.3 10*3/uL (ref 3.8–10.8)

## 2022-03-09 NOTE — Progress Notes (Signed)
CBC and CMP WNL

## 2022-04-14 NOTE — Progress Notes (Signed)
Office Visit Note  Patient: Rick Santiago             Date of Birth: 09/21/1975           MRN: [id number]             PCP: Carilyn Goodpasture, NP Referring: Carilyn Goodpasture, NP Visit Date: 04/28/2022 Occupation: @GUAROCC @  Subjective:  Medication management  History of Present Illness: Sisto Granillo is a 46 y.o. male with history of psoriatic arthritis and psoriasis.  He states his psoriasis that she has been very well controlled on Vtama topical agent.  He denies any joint pain or joint swelling.  He states he has intermittent symptoms of Planter fasciitis which he relates to hiking.  He has been tolerating methotrexate 5 tablets p.o. weekly along with folic acid 1 mg daily without any side effects.  Activities of Daily Living:  Patient reports morning stiffness for 0 minutes.   Patient Denies nocturnal pain.  Difficulty dressing/grooming: Denies Difficulty climbing stairs: Denies Difficulty getting out of chair: Denies Difficulty using hands for taps, buttons, cutlery, and/or writing: Denies  Review of Systems  Constitutional:  Negative for fatigue.  HENT:  Negative for mouth sores and mouth dryness.   Eyes:  Negative for dryness.  Respiratory:  Negative for shortness of breath.   Cardiovascular:  Negative for chest pain and palpitations.  Gastrointestinal:  Negative for blood in stool, constipation and diarrhea.  Endocrine: Negative for increased urination.  Genitourinary:  Negative for involuntary urination.  Musculoskeletal:  Negative for joint pain, gait problem, joint pain, joint swelling, myalgias, muscle weakness, morning stiffness, muscle tenderness and myalgias.  Skin:  Negative for color change, rash, hair loss and sensitivity to sunlight.  Allergic/Immunologic: Negative for susceptible to infections.  Neurological:  Negative for dizziness and headaches.  Hematological:  Negative for swollen glands.  Psychiatric/Behavioral:  Negative for depressed mood and sleep  disturbance. The patient is not nervous/anxious.     PMFS History:  Patient Active Problem List   Diagnosis Date Noted   Hyponatremia 04/05/2021   Hypokalemia 04/05/2021   Hypocalcemia 04/05/2021   Plantar fasciitis 09/29/2016   Psoriasis 09/29/2016   High risk medication use 09/29/2016   Psoriatic arthritis (HCC) 09/14/2016   FHx: hemochromatosis 09/14/2016   Encounter for methotrexate monitoring 09/14/2016   Family history of diabetes mellitus in mother 09/14/2016    Past Medical History:  Diagnosis Date   Arthritis    Psoriasis     Family History  Problem Relation Age of Onset   Diabetes Mother    Heart disease Father    Irritable bowel syndrome Daughter    Past Surgical History:  Procedure Laterality Date   MOLE REMOVAL  04/25/2022   SKIN BIOPSY     several places on back froze off and biopsied.   Social History   Social History Narrative   Not on file   Immunization History  Administered Date(s) Administered   HPV 9-valent 05/08/2019, 06/11/2019, 11/07/2019   Hpv-Unspecified 05/08/2019, 06/11/2019, 11/07/2019   Influenza Inj Mdck Quad Pf 03/19/2019   Influenza,inj,Quad PF,6+ Mos 03/27/2017, 05/07/2018   Influenza-Unspecified 04/10/2016   Moderna Covid-19 Vaccine Bivalent Booster 6yrs & up 04/22/2021, 11/18/2021   Moderna SARS-COV2 Booster Vaccination 05/13/2020   Moderna Sars-Covid-2 Vaccination 09/19/2019, 10/22/2019   Tdap 06/21/2018     Objective: Vital Signs: BP 103/70 (BP Location: Left Arm, Patient Position: Sitting, Cuff Size: Normal)   Pulse 75   Ht 5\' 7"  (1.702 m)   Wt 144 lb  6.4 oz (65.5 kg)   BMI 22.62 kg/m    Physical Exam Vitals and nursing note reviewed.  Constitutional:      Appearance: He is well-developed.  HENT:     Head: Normocephalic and atraumatic.  Eyes:     Conjunctiva/sclera: Conjunctivae normal.     Pupils: Pupils are equal, round, and reactive to light.  Cardiovascular:     Rate and Rhythm: Normal rate and regular  rhythm.     Heart sounds: Normal heart sounds.  Pulmonary:     Effort: Pulmonary effort is normal.     Breath sounds: Normal breath sounds.  Abdominal:     General: Bowel sounds are normal.     Palpations: Abdomen is soft.  Musculoskeletal:     Cervical back: Normal range of motion and neck supple.  Skin:    General: Skin is warm and dry.     Capillary Refill: Capillary refill takes less than 2 seconds.  Neurological:     Mental Status: He is alert and oriented to person, place, and time.  Psychiatric:        Behavior: Behavior normal.      Musculoskeletal Exam: Cervical, thoracic and lumbar spine were in good range of motion.  He had no SI joint tenderness.  Shoulder joints, elbow joints, wrist joints, MCPs PIPs and DIPs with good range of motion with no synovitis.  Hip joints and knee joints with good range of motion.  He had no tenderness over ankles or MTPs.  No Achilles tendinitis or planter fasciitis was noted.  CDAI Exam: CDAI Score: -- Patient Global: --; Provider Global: -- Swollen: --; Tender: -- Joint Exam 04/28/2022   No joint exam has been documented for this visit   There is currently no information documented on the homunculus. Go to the Rheumatology activity and complete the homunculus joint exam.  Investigation: No additional findings.  Imaging: No results found.  Recent Labs: Lab Results  Component Value Date   WBC 6.3 03/08/2022   HGB 15.8 03/08/2022   PLT 240 03/08/2022   NA 138 03/08/2022   K 4.5 03/08/2022   CL 103 03/08/2022   CO2 28 03/08/2022   GLUCOSE 71 03/08/2022   BUN 11 03/08/2022   CREATININE 0.96 03/08/2022   BILITOT 0.8 03/08/2022   ALKPHOS 70 10/18/2021   AST 20 03/08/2022   ALT 17 03/08/2022   PROT 6.8 03/08/2022   ALBUMIN 4.3 10/18/2021   CALCIUM 9.3 03/08/2022   GFRAA 119 11/17/2020    Speciality Comments: No specialty comments available.  Procedures:  No procedures performed Allergies: Patient has no known  allergies.   Assessment / Plan:     Visit Diagnoses: Psoriatic arthritis (HCC)-he had been doing well on methotrexate 5 tablets p.o. weekly.  He denies any joint pain or joint swelling.  No synovitis was noted.  He states he had a recent episode of Planter fasciitis related to climbing a hill.  He had no episodes of Achilles tendinitis or sacroiliitis.  He denies any episodes of uveitis.  Psoriasis - Followed by [facility].  He has been using with Vtama topically which has been helpful in resolving most of his psoriasis plaques.  He has few lesions over the nape of his neck.  High risk medication use - Methotrexate 2.5 mg 5 tablets by mouth every 7 days and folic acid 1 mg daily.  Labs obtained on March 08, 2022 were reviewed.  CBC and CMP were normal.  Advised him to get  labs every 3 months to monitor for drug toxicity.  Information on immunization was placed in the AVS.  He was advised to hold methotrexate if he develops an infection and resume after the infection resolves.  Plantar fasciitis -he had mild plantar fasciitis after climbing April which is resolved now.  H/o:followed by a podiatrist closely for several years, and he has been wearing custom made orthotics for the past 10 years.    FHx: hemochromatosis - Mother-Iron is checked yearly by PCP.   Iron panel was within normal limits on 06/17/2021.  Family history of psoriasis  Orders: No orders of the defined types were placed in this encounter.  No orders of the defined types were placed in this encounter.    Follow-Up Instructions: Return in about 5 months (around 09/27/2022) for Psoriatic arthritis.   Bo Merino, MD  Note - This record has been created using Editor, commissioning.  Chart creation errors have been sought, but may not always  have been located. Such creation errors do not reflect on  the standard of medical care.

## 2022-04-25 HISTORY — PX: MOLE REMOVAL: SHX2046

## 2022-04-28 ENCOUNTER — Ambulatory Visit: Payer: BC Managed Care – PPO | Attending: Rheumatology | Admitting: Rheumatology

## 2022-04-28 ENCOUNTER — Encounter: Payer: Self-pay | Admitting: Rheumatology

## 2022-04-28 VITALS — BP 103/70 | HR 75 | Ht 67.0 in | Wt 144.4 lb

## 2022-04-28 DIAGNOSIS — Z79899 Other long term (current) drug therapy: Secondary | ICD-10-CM | POA: Diagnosis not present

## 2022-04-28 DIAGNOSIS — L4050 Arthropathic psoriasis, unspecified: Secondary | ICD-10-CM

## 2022-04-28 DIAGNOSIS — M722 Plantar fascial fibromatosis: Secondary | ICD-10-CM

## 2022-04-28 DIAGNOSIS — E871 Hypo-osmolality and hyponatremia: Secondary | ICD-10-CM

## 2022-04-28 DIAGNOSIS — Z8349 Family history of other endocrine, nutritional and metabolic diseases: Secondary | ICD-10-CM

## 2022-04-28 DIAGNOSIS — L409 Psoriasis, unspecified: Secondary | ICD-10-CM

## 2022-04-28 DIAGNOSIS — Z840 Family history of diseases of the skin and subcutaneous tissue: Secondary | ICD-10-CM

## 2022-04-28 NOTE — Patient Instructions (Addendum)
Standing Labs We placed an order today for your standing lab work.   Please have your standing labs drawn in November and every 3 months  Please have your labs drawn 2 weeks prior to your appointment so that the provider can discuss your lab results at your appointment.  Please note that you may see your imaging and lab results in [place] before we have reviewed them. We will contact you once all results are reviewed. Please allow our office up to 72 hours to thoroughly review all of the results before contacting the office for clarification of your results.  Lab hours are: Monday through Thursday from 1:30 pm-4:30 pm and Friday from 1:30 pm- 4:00 pm  You may experience shorter wait times on Monday, Thursday or Friday afternoons,.   Effective May 09, 2022, new lab hours will be: Monday through Thursday from 8:00 am -12:30 pm and 1:00 pm-5:00 pm and Friday from 8:00 am-12:00 pm.  Please be advised, all patients with office appointments requiring lab work will take precedent over walk-in lab work.   Labs are drawn by [facility]. Please bring your co-pay at the time of your lab draw.  You may receive a bill from [employer] for your lab work.  Please note if you are on Hydroxychloroquine and and an order has been placed for a Hydroxychloroquine level, you will need to have it drawn 4 hours or more after your last dose.  If you wish to have your labs drawn at another location, please call the office 24 hours in advance so we can fax the orders.  The office is located at [address], [place], [place], [place] [address] No appointment is necessary.    If you have any questions regarding directions or hours of operation,  please call [phone].   As a reminder, please drink plenty of water prior to coming for your lab work. Thanks!   Vaccines You are taking a medication(s) that can suppress your immune system.  The following immunizations are recommended: Flu annually Covid-19  Td/Tdap  (tetanus, diphtheria, pertussis) every 10 years Pneumonia (Prevnar 15 then Pneumovax 23 at least 1 year apart.  Alternatively, can take Prevnar 20 without needing additional dose) Shingrix: 2 doses from 4 weeks to 6 months apart  Please check with your PCP to make sure you are up to date.   COVID-19 vaccine recommendations:   COVID-19 vaccine is recommended for everyone (unless you are allergic to a vaccine component), even if you are on a medication that suppresses your immune system.   If you are on Methotrexate, Cellcept (mycophenolate), Rinvoq, Morrie Sheldon, and Olumiant- hold the medication for 1 week after each vaccine.   Do not take Tylenol or any anti-inflammatory medications (NSAIDs) 24 hours prior to the COVID-19 vaccination.   There is no direct evidence about the efficacy of the COVID-19 vaccine in individuals who are on medications that suppress the immune system.   Even if you are fully vaccinated, and you are on any medications that suppress your immune system, please continue to wear a mask, maintain at least six feet social distance and practice hand hygiene.   Please see the following web sites for updated information.   [email or web address]://[email or web address]/[email or web address]-Patient-[email or web address]     If you have signs or symptoms of an infection or start antibiotics: First, call your PCP for workup of your infection. Hold your medication through the infection, until you complete your antibiotics, and until symptoms resolve if you take the following: Injectable medication (Actemra, Benlysta, Cimzia,  Cosentyx, Enbrel, Humira, Kevzara, Orencia, Remicade, Simponi, Stelara, [place], Tremfya) Methotrexate Leflunomide (Arava) Mycophenolate (Cellcept) [place]borne [place], or Rinvoq

## 2022-06-15 ENCOUNTER — Other Ambulatory Visit: Payer: Self-pay | Admitting: Physician Assistant

## 2022-06-15 NOTE — Telephone Encounter (Signed)
Next Visit: 09/27/2022  Last Visit: 04/28/2022  Last Fill: 03/07/2022  DX: Psoriatic arthritis   Current Dose per office note on 04/28/2022: Methotrexate 2.5 mg 5 tablets by mouth every 7 days   Labs: 03/08/2022 CBC and CMP WNL   Patient states he will be updating labs this Thursday with PCP at his annual physical.   Okay to refill methotrexate?

## 2022-06-29 ENCOUNTER — Telehealth: Payer: Self-pay | Admitting: *Deleted

## 2022-06-29 NOTE — Telephone Encounter (Signed)
Labs received from:[facility]   Drawn on:06/16/2022  Reviewed by:Dr. Pollyann Savoy   Labs drawn:Lipid Panel, TSH, Iron Panel, CBC,CMP  Results:Calc LDL 114   Mono % 3.7   Mono # 0.2  Patient on MTX 5 tabs po weekly.

## 2022-09-11 ENCOUNTER — Other Ambulatory Visit: Payer: Self-pay | Admitting: Physician Assistant

## 2022-09-12 ENCOUNTER — Other Ambulatory Visit: Payer: Self-pay | Admitting: Family Medicine

## 2022-09-12 DIAGNOSIS — M25561 Pain in right knee: Secondary | ICD-10-CM

## 2022-09-12 NOTE — Telephone Encounter (Signed)
Next Visit: 09/27/2022  Last Visit: 04/28/2022  Last Fill: 06/15/2022  DX: Psoriatic arthritis   Current Dose per office note 04/28/2022: Methotrexate 2.5 mg 5 tablets by mouth every 7 days   Labs: 06/16/2022 Calc LDL 114   Mono % 3.7   Mono # 0.2  Patient to update labs at upcoming appointment on 09/27/2022  Okay to refill MTX?

## 2022-09-13 NOTE — Progress Notes (Unsigned)
Office Visit Note  Patient: Rick Santiago             Date of Birth: June 13, 1976           MRN: [id number]             PCP: Almedia Balls, NP Referring: Almedia Balls, NP Visit Date: 09/27/2022 Occupation: @GUAROCC @  Subjective:  Medication monitoring   History of Present Illness: Rick Santiago is a 47 y.o. male with history of psoriatic arthritis  patient is currently taking methotrexate 5 tablets by mouth once weekly and folic acid 1 mg daily.  He continues to tolerate methotrexate without any side effects and has not missed any doses recently.  He states that he has noticed a significant improvement in his psoriasis while using the [place].  The psoriasis on the extensor surface of his elbows and on his lower legs has resolved.  He continues to have some psoriasis along his hairline and behind his ears for which she has a prescription for clobetasol cream which she has not yet used. Patient reports that in December 2023 he was in a deep squat and has had some increased discomfort in the right knee since then.  He experiences pain and stiffness in the knee with full extension and full flexion.  There is concern for possible Baker's cyst.  He is scheduled for an ultrasound for further evaluation on 10/06/2022.  He is also scheduled to start physical therapy at [facility].  He denies any warmth or swelling in the right knee.  He has not had any obvious mechanical symptoms.  He denies any other joint pain or joint swelling.  He denies any Achilles tendinitis or plantar fasciitis.  He denies any SI joint pain. He has not had any recent or recurrent infections.   Activities of Daily Living:  Patient reports morning stiffness for 0 minute.   Patient Denies nocturnal pain.  Difficulty dressing/grooming: Denies Difficulty climbing stairs: Denies Difficulty getting out of chair: Denies Difficulty using hands for taps, buttons, cutlery, and/or writing: Denies  Review of Systems   Constitutional:  Negative for fatigue.  HENT:  Negative for mouth sores and mouth dryness.   Eyes:  Negative for dryness.  Respiratory:  Negative for shortness of breath.   Cardiovascular:  Negative for chest pain and palpitations.  Gastrointestinal:  Negative for blood in stool, constipation and diarrhea.  Endocrine: Negative for increased urination.  Genitourinary:  Negative for involuntary urination.  Musculoskeletal:  Positive for muscle weakness. Negative for joint pain, gait problem, joint pain, joint swelling, myalgias, morning stiffness, muscle tenderness and myalgias.  Skin:  Negative for color change, rash, hair loss and sensitivity to sunlight.  Allergic/Immunologic: Negative for susceptible to infections.  Neurological:  Negative for dizziness and headaches.  Hematological:  Negative for swollen glands.  Psychiatric/Behavioral:  Negative for depressed mood and sleep disturbance. The patient is not nervous/anxious.     PMFS History:  Patient Active Problem List   Diagnosis Date Noted   Hyponatremia 04/05/2021   Hypokalemia 04/05/2021   Hypocalcemia 04/05/2021   Plantar fasciitis 09/29/2016   Psoriasis 09/29/2016   High risk medication use 09/29/2016   Psoriatic arthritis ([place]) 09/14/2016   FHx: hemochromatosis 09/14/2016   Encounter for methotrexate monitoring 09/14/2016   Family history of diabetes mellitus in mother 09/14/2016    Past Medical History:  Diagnosis Date   Arthritis    Psoriasis     Family History  Problem Relation Age of Onset   Diabetes  Mother    Heart disease Father    Irritable bowel syndrome Daughter    Past Surgical History:  Procedure Laterality Date   MOLE REMOVAL  04/25/2022   SKIN BIOPSY     several places on back froze off and biopsied.   Social History   Social History Narrative   Not on file   Immunization History  Administered Date(s) Administered   HPV 9-valent 05/08/2019, 06/11/2019, 11/07/2019   Hpv-Unspecified  05/08/2019, 06/11/2019, 11/07/2019   Influenza Inj Mdck Quad Pf 03/19/2019   Influenza,inj,Quad PF,6+ Mos 03/27/2017, 05/07/2018   Influenza-Unspecified 04/10/2016   Moderna Covid-19 Vaccine Bivalent Booster 76yrs & up 04/22/2021, 11/18/2021   Moderna SARS-COV2 Booster Vaccination 05/13/2020   Moderna Sars-Covid-2 Vaccination 09/19/2019, 10/22/2019   Tdap 06/21/2018     Objective: Vital Signs: BP 123/83 (BP Location: Left Arm, Patient Position: Sitting, Cuff Size: Normal)   Pulse 71   Resp 12   Ht 5\' 7"  (1.702 m)   Wt 145 lb (65.8 kg)   BMI 22.71 kg/m    Physical Exam Vitals and nursing note reviewed.  Constitutional:      Appearance: He is well-developed.  HENT:     Head: Normocephalic and atraumatic.  Eyes:     Conjunctiva/sclera: Conjunctivae normal.     Pupils: Pupils are equal, round, and reactive to light.  Cardiovascular:     Rate and Rhythm: Normal rate and regular rhythm.     Heart sounds: Normal heart sounds.  Pulmonary:     Effort: Pulmonary effort is normal.     Breath sounds: Normal breath sounds.  Abdominal:     General: Bowel sounds are normal.     Palpations: Abdomen is soft.  Musculoskeletal:     Cervical back: Normal range of motion and neck supple.  Skin:    General: Skin is warm and dry.     Capillary Refill: Capillary refill takes less than 2 seconds.     Comments: Erythematous patch of psoriasis behind both ears noted.  Neurological:     Mental Status: He is alert and oriented to person, place, and time.  Psychiatric:        Behavior: Behavior normal.      Musculoskeletal Exam: C-spine, thoracic spine, lumbar spine have good range of motion.  No midline spinal tenderness.  No SI joint tenderness.  Shoulder joints, elbow joints, wrist joints, MCPs, PIPs, DIPs have good range of motion with no synovitis.  Complete fist formation bilaterally.  Hip joints have good range of motion with no groin pain.  Knee joints have good range of motion with no  warmth or effusion.  Some tenderness in the popliteal space of the right knee.  No calf tightness or tenderness noted.  Ankle joints have good range of motion with no tenderness or synovitis.  No evidence of Achilles tendinitis.  CDAI Exam: CDAI Score: -- Patient Global: --; Provider Global: -- Swollen: --; Tender: -- Joint Exam 09/27/2022   No joint exam has been documented for this visit   There is currently no information documented on the homunculus. Go to the Rheumatology activity and complete the homunculus joint exam.  Investigation: No additional findings.  Imaging: No results found.  Recent Labs: Lab Results  Component Value Date   WBC 6.3 03/08/2022   HGB 15.8 03/08/2022   PLT 240 03/08/2022   NA 138 03/08/2022   K 4.5 03/08/2022   CL 103 03/08/2022   CO2 28 03/08/2022   GLUCOSE 71 03/08/2022  BUN 11 03/08/2022   CREATININE 0.96 03/08/2022   BILITOT 0.8 03/08/2022   ALKPHOS 70 10/18/2021   AST 20 03/08/2022   ALT 17 03/08/2022   PROT 6.8 03/08/2022   ALBUMIN 4.3 10/18/2021   CALCIUM 9.3 03/08/2022   GFRAA 119 11/17/2020    Speciality Comments: No specialty comments available.  Procedures:  No procedures performed Allergies: Patient has no known allergies.   Assessment / Plan:     Visit Diagnoses: Psoriatic arthritis ([place]): He has no synovitis or dactylitis on examination today.  He has not had any signs or symptoms of a psoriatic arthritis flare.  No evidence of Achilles tendinitis or plantar fasciitis.  No SI joint tenderness upon palpation.  He has not been experiencing any nocturnal pain or difficulty with ADLs.  His psoriasis has been significantly better controlled with the use of Vtama.  Overall his psoriatic arthritis remains well-controlled taking methotrexate 5 tablets by mouth once weekly along with folic acid 1 mg daily.  He has been tolerating methotrexate without any side effects and has not missed any doses recently.  He continues to avoid the  use of over-the-counter products for pain relief.  He has not had any recent or recurrent infections.  He is up-to-date with the recommended vaccines.  He will remain on methotrexate along with folic acid as prescribed.  He was advised to notify us if he develops signs or symptoms of a flare.  He will follow-up in the office in 5 months or sooner if needed.  Psoriasis - Followed by [facility].  The psoriasis on the extensor surface of his elbows and bilateral lower legs has resolved with the use of Vtama.  He has a small patch of psoriasis behind both ears for which he has a prescription for clobetasol cream which he can apply topically PRN.  High risk medication use - Methotrexate 2.5 mg 5 tablets by mouth every 7 days and folic acid 1 mg daily.  CBC and CMP updated on 06/16/22. Orders for CBC and CMP released today.  His next lab work will be due in June and every 3 months.  No recent or recurrent infections.  Discussed the importance of holding methotrexate if he develops signs or symptoms of an infection and to resume once the infection has completely cleared.  Up-to-date with all eligible vaccines including the COVID booster, annual flu shot, Shingrix x 2, and Pneumovax. - Plan: CBC with Differential/Platelet, COMPLETE METABOLIC PANEL WITH GFR  Chronic pain of right knee: Since December 2023.  He has had discomfort and stiffness with hyperextension and full flexion of the right knee.  There is concern for possible Baker's cyst.  He has been evaluated by his PCP and is scheduled for an ultrasound on 10/06/2022 for further evaluation.  He is not currently experiencing any mechanical symptoms.  He has also been referred to physical therapy at [facility].  On examination he has good range of motion of the right knee with no warmth or effusion.  No instability was noted.  He was advised to notify us of the ultrasound results.  He will also notify us if he would like a referral to orthopedics  for further evaluation.  Plantar fasciitis: Not currently symptomatic.  Other medical conditions are listed as follows:  Hyponatremia  FHx: hemochromatosis  Family history of psoriasis  Orders: Orders Placed This Encounter  Procedures   CBC with Differential/Platelet   COMPLETE METABOLIC PANEL WITH GFR   No orders of the defined  types were placed in this encounter.    Follow-Up Instructions: Return in about 5 months (around 02/27/2023) for Psoriatic arthritis.   Ofilia Neas, PA-C  Note - This record has been created using Dragon software.  Chart creation errors have been sought, but may not always  have been located. Such creation errors do not reflect on  the standard of medical care.

## 2022-09-20 ENCOUNTER — Telehealth: Payer: Self-pay | Admitting: *Deleted

## 2022-09-20 NOTE — Telephone Encounter (Signed)
Received a fax from [facility] [facility] about MTX use without Folic Acid Therapy. It states an analysis of prescription claims suggests that the patient may be receiving methotrexate without folic acid supplementation.  Contacted patient and advised patient about the fax we received. Patient states he has extra on hand and has been taking it as prescribed.

## 2022-09-27 ENCOUNTER — Ambulatory Visit: Payer: BC Managed Care – PPO | Attending: Physician Assistant | Admitting: Physician Assistant

## 2022-09-27 ENCOUNTER — Encounter: Payer: Self-pay | Admitting: Physician Assistant

## 2022-09-27 VITALS — BP 123/83 | HR 71 | Resp 12 | Ht 67.0 in | Wt 145.0 lb

## 2022-09-27 DIAGNOSIS — L409 Psoriasis, unspecified: Secondary | ICD-10-CM

## 2022-09-27 DIAGNOSIS — M722 Plantar fascial fibromatosis: Secondary | ICD-10-CM

## 2022-09-27 DIAGNOSIS — G8929 Other chronic pain: Secondary | ICD-10-CM

## 2022-09-27 DIAGNOSIS — Z840 Family history of diseases of the skin and subcutaneous tissue: Secondary | ICD-10-CM

## 2022-09-27 DIAGNOSIS — E871 Hypo-osmolality and hyponatremia: Secondary | ICD-10-CM

## 2022-09-27 DIAGNOSIS — M25561 Pain in right knee: Secondary | ICD-10-CM

## 2022-09-27 DIAGNOSIS — L4050 Arthropathic psoriasis, unspecified: Secondary | ICD-10-CM

## 2022-09-27 DIAGNOSIS — Z8349 Family history of other endocrine, nutritional and metabolic diseases: Secondary | ICD-10-CM

## 2022-09-27 DIAGNOSIS — Z79899 Other long term (current) drug therapy: Secondary | ICD-10-CM

## 2022-09-28 LAB — CBC WITH DIFFERENTIAL/PLATELET
Absolute Monocytes: 491 cells/uL (ref 200–950)
Basophils Absolute: 50 cells/uL (ref 0–200)
Basophils Relative: 0.8 %
Eosinophils Absolute: 63 cells/uL (ref 15–500)
Eosinophils Relative: 1 %
HCT: 46.5 % (ref 38.5–50.0)
Hemoglobin: 15.9 g/dL (ref 13.2–17.1)
Lymphs Abs: 1808 cells/uL (ref 850–3900)
MCH: 31.9 pg (ref 27.0–33.0)
MCHC: 34.2 g/dL (ref 32.0–36.0)
MCV: 93.4 fL (ref 80.0–100.0)
MPV: 10.2 fL (ref 7.5–12.5)
Monocytes Relative: 7.8 %
Neutro Abs: 3887 cells/uL (ref 1500–7800)
Neutrophils Relative %: 61.7 %
Platelets: 282 10*3/uL (ref 140–400)
RBC: 4.98 10*6/uL (ref 4.20–5.80)
RDW: 13 % (ref 11.0–15.0)
Total Lymphocyte: 28.7 %
WBC: 6.3 10*3/uL (ref 3.8–10.8)

## 2022-09-28 LAB — COMPLETE METABOLIC PANEL WITH GFR
AG Ratio: 1.8 (calc) (ref 1.0–2.5)
ALT: 21 U/L (ref 9–46)
AST: 20 U/L (ref 10–40)
Albumin: 4.5 g/dL (ref 3.6–5.1)
Alkaline phosphatase (APISO): 69 U/L (ref 36–130)
BUN: 10 mg/dL (ref 7–25)
CO2: 30 mmol/L (ref 20–32)
Calcium: 9.5 mg/dL (ref 8.6–10.3)
Chloride: 101 mmol/L (ref 98–110)
Creat: 0.86 mg/dL (ref 0.60–1.29)
Globulin: 2.5 g/dL (calc) (ref 1.9–3.7)
Glucose, Bld: 79 mg/dL (ref 65–99)
Potassium: 4.4 mmol/L (ref 3.5–5.3)
Sodium: 138 mmol/L (ref 135–146)
Total Bilirubin: 0.7 mg/dL (ref 0.2–1.2)
Total Protein: 7 g/dL (ref 6.1–8.1)
eGFR: 107 mL/min/{1.73_m2} (ref >=60)

## 2022-09-28 NOTE — Progress Notes (Signed)
CBC and CMP WNL

## 2022-10-06 ENCOUNTER — Ambulatory Visit
Admission: RE | Admit: 2022-10-06 | Discharge: 2022-10-06 | Disposition: A | Discharge status: Home / Self Care | Payer: BC Managed Care – PPO | Source: Ambulatory Visit | Attending: Family Medicine | Admitting: Family Medicine

## 2022-10-06 DIAGNOSIS — M25561 Pain in right knee: Secondary | ICD-10-CM

## 2022-11-29 ENCOUNTER — Other Ambulatory Visit: Payer: Self-pay | Admitting: *Deleted

## 2022-11-29 MED ORDER — METHOTREXATE SODIUM 2.5 MG PO TABS
12.5000 mg | ORAL_TABLET | ORAL | 0 refills | Status: DC
Start: 2022-11-29 — End: 2023-02-20

## 2022-11-29 NOTE — Telephone Encounter (Signed)
Patient contacted the office asking when his prescription for MTX will be refilled.   Returned call to patient to advise we have not received a refill request yet. Patient advised his prescription is due to be refilled within the next week so we could go ahead and work on sending it in. Patient would like to prescription to be sent to the [facility]. Patient reminded he is due for labs in the middle of June.   Last Fill: 09/12/2022  Labs: 09/27/2022 CBC and CMP WNL   Next Visit: 02/28/2023  Last Visit: 09/27/2022  DX: Psoriatic arthritis   Current Dose per office note 09/27/2022: Methotrexate 2.5 mg 5 tablets by mouth every 7 days   Okay to refill Methotrexate?

## 2023-01-09 ENCOUNTER — Other Ambulatory Visit: Payer: Self-pay | Admitting: *Deleted

## 2023-01-09 DIAGNOSIS — Z79899 Other long term (current) drug therapy: Secondary | ICD-10-CM

## 2023-01-10 LAB — COMPLETE METABOLIC PANEL WITH GFR
AG Ratio: 2.3 (calc) (ref 1.0–2.5)
ALT: 17 U/L (ref 9–46)
AST: 17 U/L (ref 10–40)
Albumin: 4.5 g/dL (ref 3.6–5.1)
Alkaline phosphatase (APISO): 65 U/L (ref 36–130)
BUN/Creatinine Ratio: 7 (calc) (ref 6–22)
BUN: 6 mg/dL — ABNORMAL LOW (ref 7–25)
CO2: 30 mmol/L (ref 20–32)
Calcium: 9.4 mg/dL (ref 8.6–10.3)
Chloride: 104 mmol/L (ref 98–110)
Creat: 0.88 mg/dL (ref 0.60–1.29)
Globulin: 2 g/dL (calc) (ref 1.9–3.7)
Glucose, Bld: 61 mg/dL — ABNORMAL LOW (ref 65–99)
Potassium: 4.2 mmol/L (ref 3.5–5.3)
Sodium: 140 mmol/L (ref 135–146)
Total Bilirubin: 0.6 mg/dL (ref 0.2–1.2)
Total Protein: 6.5 g/dL (ref 6.1–8.1)
eGFR: 106 mL/min/{1.73_m2} (ref >=60)

## 2023-01-10 LAB — CBC WITH DIFFERENTIAL/PLATELET
Absolute Monocytes: 391 cells/uL (ref 200–950)
Basophils Absolute: 62 cells/uL (ref 0–200)
Basophils Relative: 1 %
Eosinophils Absolute: 68 cells/uL (ref 15–500)
Eosinophils Relative: 1.1 %
HCT: 45.9 % (ref 38.5–50.0)
Hemoglobin: 15.6 g/dL (ref 13.2–17.1)
Lymphs Abs: 1364 cells/uL (ref 850–3900)
MCH: 31.5 pg (ref 27.0–33.0)
MCHC: 34 g/dL (ref 32.0–36.0)
MCV: 92.7 fL (ref 80.0–100.0)
MPV: 10.2 fL (ref 7.5–12.5)
Monocytes Relative: 6.3 %
Neutro Abs: 4315 cells/uL (ref 1500–7800)
Neutrophils Relative %: 69.6 %
Platelets: 253 10*3/uL (ref 140–400)
RBC: 4.95 10*6/uL (ref 4.20–5.80)
RDW: 13.1 % (ref 11.0–15.0)
Total Lymphocyte: 22 %
WBC: 6.2 10*3/uL (ref 3.8–10.8)

## 2023-01-10 NOTE — Progress Notes (Signed)
CBC and CMP are stable.

## 2023-02-14 NOTE — Progress Notes (Unsigned)
Office Visit Note  Patient: Rick Santiago             Date of Birth: Aug 22, 1975           MRN: [id number]             PCP: Carilyn Goodpasture, NP Referring: Carilyn Goodpasture, NP Visit Date: 02/28/2023 Occupation: @GUAROCC @  Subjective:  Medication monitoring   History of Present Illness: Rick Santiago is a 47 y.o. male with history of psoriatic arthritis. He remains on Methotrexate 2.5 mg 5 tablets by mouth every 7 days and folic acid 1 mg 1 tablet 5 days weekly.  Patient continue to tolerate methotrexate without any side effects.  He has not missed any doses of methotrexate recently.  Patient denies any signs or symptoms of a psoriatic arthritis flare.  He has been using vtama which has been managing his psoriasis.  He denies any recent or recurrent infections. He has noticed a rash on bilateral upper arms recently and is unsure if it is a heat related rash or some contact dermatitis.  He was recently traveling so it has been difficult to identify a trigger.  The rash has gradually been improving.  He is no longer having any itching.  He is scheduled to follow-up with dermatology next month.  He denies any other new medical conditions.       Activities of Daily Living:  Patient reports morning stiffness for 0  none .   Patient Denies nocturnal pain.  Difficulty dressing/grooming: Denies Difficulty climbing stairs: Denies Difficulty getting out of chair: Denies Difficulty using hands for taps, buttons, cutlery, and/or writing: Denies  Review of Systems  Constitutional:  Negative for fatigue.  HENT:  Negative for mouth sores and mouth dryness.   Eyes:  Negative for dryness.  Respiratory:  Negative for shortness of breath.   Cardiovascular:  Negative for chest pain and palpitations.  Gastrointestinal:  Negative for blood in stool, constipation and diarrhea.  Endocrine: Negative for increased urination.  Genitourinary:  Negative for involuntary urination.  Musculoskeletal:  Negative  for joint pain, gait problem, joint pain, joint swelling, myalgias, muscle weakness, morning stiffness, muscle tenderness and myalgias.  Skin:  Negative for color change, rash, hair loss and sensitivity to sunlight.  Allergic/Immunologic: Negative for susceptible to infections.  Neurological:  Negative for dizziness and headaches.  Hematological:  Negative for swollen glands.  Psychiatric/Behavioral:  Negative for depressed mood and sleep disturbance. The patient is not nervous/anxious.     PMFS History:  Patient Active Problem List   Diagnosis Date Noted   Hyponatremia 04/05/2021   Hypokalemia 04/05/2021   Hypocalcemia 04/05/2021   Plantar fasciitis 09/29/2016   Psoriasis 09/29/2016   High risk medication use 09/29/2016   Psoriatic arthritis ([facility]) 09/14/2016   FHx: hemochromatosis 09/14/2016   Encounter for methotrexate monitoring 09/14/2016   Family history of diabetes mellitus in mother 09/14/2016    Past Medical History:  Diagnosis Date   Arthritis    Psoriasis     Family History  Problem Relation Age of Onset   Diabetes Mother    Heart disease Father    Irritable bowel syndrome Daughter    Past Surgical History:  Procedure Laterality Date   MOLE REMOVAL  04/25/2022   SKIN BIOPSY     several places on back froze off and biopsied.   Social History   Social History Narrative   Not on file   Immunization History  Administered Date(s) Administered   HPV 9-valent 05/08/2019, 06/11/2019,  11/07/2019   Hpv-Unspecified 05/08/2019, 06/11/2019, 11/07/2019   Influenza Inj Mdck Quad Pf 03/19/2019   Influenza,inj,Quad PF,6+ Mos 03/27/2017, 05/07/2018   Influenza-Unspecified 04/10/2016   Moderna Covid-19 Vaccine Bivalent Booster 66yrs & up 04/22/2021, 11/18/2021   Moderna SARS-COV2 Booster Vaccination 05/13/2020   Moderna Sars-Covid-2 Vaccination 09/19/2019, 10/22/2019   Tdap 06/21/2018     Objective: Vital Signs: BP 111/77 (BP Location: Left Arm, Patient Position:  Sitting, Cuff Size: Normal)   Pulse 65   Resp 15   Ht 5\' 7"  (1.702 m)   Wt 144 lb (65.3 kg)   BMI 22.55 kg/m    Physical Exam Vitals and nursing note reviewed.  Constitutional:      Appearance: He is well-developed.  HENT:     Head: Normocephalic and atraumatic.  Eyes:     Conjunctiva/sclera: Conjunctivae normal.     Pupils: Pupils are equal, round, and reactive to light.  Cardiovascular:     Rate and Rhythm: Normal rate and regular rhythm.     Heart sounds: Normal heart sounds.  Pulmonary:     Effort: Pulmonary effort is normal.     Breath sounds: Normal breath sounds.  Abdominal:     General: Bowel sounds are normal.     Palpations: Abdomen is soft.  Musculoskeletal:     Cervical back: Normal range of motion and neck supple.  Skin:    General: Skin is warm and dry.     Capillary Refill: Capillary refill takes less than 2 seconds.  Neurological:     Mental Status: He is alert and oriented to person, place, and time.  Psychiatric:        Behavior: Behavior normal.      Musculoskeletal Exam: C-spine, thoracic spine, lumbar spine have good range of motion with no discomfort.  No midline spinal tenderness.  No SI joint tenderness.  Shoulder joints, elbow joints, wrist joints, MCPs, PIPs, DIPs have good range of motion with no synovitis.  Complete fist formation bilaterally.  Hip joints have good range of motion with no groin pain.  Knee joints have good range of motion with no warmth or effusion.  Ankle joints have good range of motion with no tenderness or joint swelling.  No evidence of Achilles tendinitis.  CDAI Exam: CDAI Score: -- Patient Global: --; Provider Global: -- Swollen: --; Tender: -- Joint Exam 02/28/2023   No joint exam has been documented for this visit   There is currently no information documented on the homunculus. Go to the Rheumatology activity and complete the homunculus joint exam.  Investigation: No additional findings.  Imaging: No results  found.  Recent Labs: Lab Results  Component Value Date   WBC 6.2 01/09/2023   HGB 15.6 01/09/2023   PLT 253 01/09/2023   NA 140 01/09/2023   K 4.2 01/09/2023   CL 104 01/09/2023   CO2 30 01/09/2023   GLUCOSE 61 (L) 01/09/2023   BUN 6 (L) 01/09/2023   CREATININE 0.88 01/09/2023   BILITOT 0.6 01/09/2023   ALKPHOS 70 10/18/2021   AST 17 01/09/2023   ALT 17 01/09/2023   PROT 6.5 01/09/2023   ALBUMIN 4.3 10/18/2021   CALCIUM 9.4 01/09/2023   GFRAA 119 11/17/2020    Speciality Comments: Right Knee Ultrasound pm 10/06/2022, 1.5 cm Baker's cyst   Procedures:  No procedures performed Allergies: Patient has no known allergies.   Assessment / Plan:     Visit Diagnoses: Psoriatic arthritis ([facility]): No synovitis or dactylitis noted on examination today.  No  SI joint tenderness upon palpation today.  His plantar fasciitis has been well-controlled with the use of orthotics.  No evidence of Achilles tendinitis.  His psoriasis has been well-controlled with the use of Vtama.   He has clinically been doing well taking methotrexate 5 tablets by mouth once weekly and folic acid 1 mg daily.  He is tolerating methotrexate without any side effects and has not had any interruptions in therapy.  He has not had any recent or recurrent infections.  Patient will remain on methotrexate and folic acid as prescribed.  He was advised to notify us if he develops signs or symptoms of a flare.  He will follow-up in the office in 5 months or sooner if needed.  Psoriasis - Followed by [facility]. He has been using Vtama topically as needed.   High risk medication use - Methotrexate 2.5 mg 5 tablets by mouth every 7 days and folic acid 1 mg daily. CBC and CMP updated on 01/09/23.  His next lab work will be due in October and every 3 months.  Standing orders for CBC and CMP remain in place. No recent or recurrent infections.  Discussed the importance of holding methotrexate if he develops signs or symptoms of an  infection and to resume once the infection has completely cleared.   Plantar fasciitis: Patient wears orthotics and proper fitting shoes which manage his symptoms.    Hyponatremia: Sodium was within normal limits-140 on 01/09/23.   FHx: hemochromatosis  Family history of psoriasis  Orders: No orders of the defined types were placed in this encounter.  No orders of the defined types were placed in this encounter.    Follow-Up Instructions: Return in about 5 months (around 07/31/2023) for Psoriatic arthritis.   Gearldine Bienenstock, PA-C  Note - This record has been created using Dragon software.  Chart creation errors have been sought, but may not always  have been located. Such creation errors do not reflect on  the standard of medical care.

## 2023-02-18 ENCOUNTER — Other Ambulatory Visit: Payer: Self-pay | Admitting: Rheumatology

## 2023-02-20 NOTE — Telephone Encounter (Signed)
Last Fill: 11/29/2022  Labs: 01/09/2023 CBC and CMP are stable.   Next Visit: 02/28/2023  Last Visit: 09/27/2022  DX: Psoriatic arthritis   Current Dose per office note 09/27/2022: Methotrexate 2.5 mg 5 tablets by mouth every 7 days   Okay to refill Methotrexate?

## 2023-02-28 ENCOUNTER — Ambulatory Visit: Payer: BC Managed Care – PPO | Attending: Physician Assistant | Admitting: Physician Assistant

## 2023-02-28 ENCOUNTER — Encounter: Payer: Self-pay | Admitting: Physician Assistant

## 2023-02-28 VITALS — BP 111/77 | HR 65 | Resp 15 | Ht 67.0 in | Wt 144.0 lb

## 2023-02-28 DIAGNOSIS — L4050 Arthropathic psoriasis, unspecified: Secondary | ICD-10-CM | POA: Diagnosis not present

## 2023-02-28 DIAGNOSIS — M722 Plantar fascial fibromatosis: Secondary | ICD-10-CM | POA: Diagnosis not present

## 2023-02-28 DIAGNOSIS — L409 Psoriasis, unspecified: Secondary | ICD-10-CM | POA: Diagnosis not present

## 2023-02-28 DIAGNOSIS — E871 Hypo-osmolality and hyponatremia: Secondary | ICD-10-CM

## 2023-02-28 DIAGNOSIS — Z8349 Family history of other endocrine, nutritional and metabolic diseases: Secondary | ICD-10-CM

## 2023-02-28 DIAGNOSIS — Z79899 Other long term (current) drug therapy: Secondary | ICD-10-CM

## 2023-02-28 DIAGNOSIS — Z840 Family history of diseases of the skin and subcutaneous tissue: Secondary | ICD-10-CM

## 2023-02-28 NOTE — Patient Instructions (Signed)

## 2023-05-13 ENCOUNTER — Other Ambulatory Visit: Payer: Self-pay | Admitting: Physician Assistant

## 2023-05-15 NOTE — Telephone Encounter (Signed)
Last Fill: 02/20/2023   Labs: 01/09/2023  CBC and CMP are stable. I called patient, patient will have labs drawn this week.  Next Visit: 08/01/2023  Last Visit: 02/28/2023  DX: Psoriatic arthritis   Current Dose per office note 02/28/2023: Methotrexate 2.5 mg 5 tablets by mouth every 7 days   Okay to refill Methotrexate?

## 2023-05-18 ENCOUNTER — Other Ambulatory Visit: Payer: Self-pay | Admitting: *Deleted

## 2023-05-18 DIAGNOSIS — Z79899 Other long term (current) drug therapy: Secondary | ICD-10-CM

## 2023-05-19 LAB — CBC WITH DIFFERENTIAL/PLATELET
Absolute Lymphocytes: 1656 {cells}/uL (ref 850–3900)
Absolute Monocytes: 444 {cells}/uL (ref 200–950)
Basophils Absolute: 42 {cells}/uL (ref 0–200)
Basophils Relative: 0.7 %
Eosinophils Absolute: 60 {cells}/uL (ref 15–500)
Eosinophils Relative: 1 %
HCT: 44.3 % (ref 38.5–50.0)
Hemoglobin: 15.2 g/dL (ref 13.2–17.1)
MCH: 31.8 pg (ref 27.0–33.0)
MCHC: 34.3 g/dL (ref 32.0–36.0)
MCV: 92.7 fL (ref 80.0–100.0)
MPV: 9.9 fL (ref 7.5–12.5)
Monocytes Relative: 7.4 %
Neutro Abs: 3798 {cells}/uL (ref 1500–7800)
Neutrophils Relative %: 63.3 %
Platelets: 248 10*3/uL (ref 140–400)
RBC: 4.78 10*6/uL (ref 4.20–5.80)
RDW: 12.8 % (ref 11.0–15.0)
Total Lymphocyte: 27.6 %
WBC: 6 10*3/uL (ref 3.8–10.8)

## 2023-05-19 LAB — COMPLETE METABOLIC PANEL WITH GFR
AG Ratio: 2.1 (calc) (ref 1.0–2.5)
ALT: 18 U/L (ref 9–46)
AST: 17 U/L (ref 10–40)
Albumin: 4.4 g/dL (ref 3.6–5.1)
Alkaline phosphatase (APISO): 68 U/L (ref 36–130)
BUN: 8 mg/dL (ref 7–25)
CO2: 29 mmol/L (ref 20–32)
Calcium: 9.2 mg/dL (ref 8.6–10.3)
Chloride: 103 mmol/L (ref 98–110)
Creat: 0.83 mg/dL (ref 0.60–1.29)
Globulin: 2.1 g/dL (ref 1.9–3.7)
Glucose, Bld: 82 mg/dL (ref 65–99)
Potassium: 4.4 mmol/L (ref 3.5–5.3)
Sodium: 141 mmol/L (ref 135–146)
Total Bilirubin: 0.6 mg/dL (ref 0.2–1.2)
Total Protein: 6.5 g/dL (ref 6.1–8.1)
eGFR: 107 mL/min/{1.73_m2} (ref >=60)

## 2023-05-19 NOTE — Progress Notes (Signed)
CBC and CMP are normal.

## 2023-06-22 LAB — LAB REPORT - SCANNED
EGFR: 107
HM HIV Screening: NEGATIVE

## 2023-07-18 NOTE — Progress Notes (Signed)
Office Visit Note  Patient: Rick Santiago             Date of Birth: Aug 27, 1975           MRN: [id number]             PCP: Carilyn Goodpasture, NP Referring: Carilyn Goodpasture, NP Visit Date: 08/01/2023 Occupation: @GUAROCC @  Subjective:  Medication monitoring   History of Present Illness: Rick Santiago is a 48 y.o. male with history of psoriatic arthritis.  Patient remains on  Methotrexate 2.5 mg 5 tablets by mouth every 7 days and folic acid 1 mg daily.  He is tolerating methotrexate without any side effects and has not had any recent gaps in therapy.  He denies any signs or symptoms of a flare.  He has not been experiencing any stiffness, nocturnal pain, or difficulty with ADLs.  He denies any joint swelling.  He has 1 small patch of psoriasis on his torso.  He has not needed to use the vtama recently.  He denies any recent or recurrent infections.  Activities of Daily Living:  Patient reports morning stiffness for 0 minutes.   Patient Denies nocturnal pain.  Difficulty dressing/grooming: Denies Difficulty climbing stairs: Denies Difficulty getting out of chair: Denies Difficulty using hands for taps, buttons, cutlery, and/or writing: Denies  Review of Systems  Constitutional:  Negative for fatigue.  HENT:  Negative for mouth sores, mouth dryness and nose dryness.   Eyes:  Negative for pain and dryness.  Respiratory:  Negative for cough, shortness of breath and difficulty breathing.   Cardiovascular:  Negative for chest pain and palpitations.  Gastrointestinal:  Negative for blood in stool, constipation and diarrhea.  Endocrine: Negative for increased urination.  Genitourinary:  Negative for painful urination and involuntary urination.  Musculoskeletal:  Negative for joint pain, gait problem, joint pain, joint swelling, myalgias, muscle weakness, morning stiffness, muscle tenderness and myalgias.  Skin:  Negative for color change, rash, hair loss and sensitivity to sunlight.   Allergic/Immunologic: Negative for susceptible to infections.  Neurological:  Negative for dizziness and headaches.  Hematological:  Negative for swollen glands.  Psychiatric/Behavioral:  Negative for depressed mood and sleep disturbance. The patient is not nervous/anxious.     PMFS History:  Patient Active Problem List   Diagnosis Date Noted   Hyponatremia 04/05/2021   Hypokalemia 04/05/2021   Hypocalcemia 04/05/2021   Plantar fasciitis 09/29/2016   Psoriasis 09/29/2016   High risk medication use 09/29/2016   Psoriatic arthritis (HCC) 09/14/2016   FHx: hemochromatosis 09/14/2016   Encounter for methotrexate monitoring 09/14/2016   Family history of diabetes mellitus in mother 09/14/2016    Past Medical History:  Diagnosis Date   Arthritis    Psoriasis     Family History  Problem Relation Age of Onset   Diabetes Mother    Heart disease Father    Irritable bowel syndrome Daughter    Past Surgical History:  Procedure Laterality Date   MOLE REMOVAL  04/25/2022   SKIN BIOPSY     several places on back froze off and biopsied.   Social History   Social History Narrative   Not on file   Immunization History  Administered Date(s) Administered   HPV 9-valent 05/08/2019, 06/11/2019, 11/07/2019   Hpv-Unspecified 05/08/2019, 06/11/2019, 11/07/2019   Influenza Inj Mdck Quad Pf 03/19/2019   Influenza,inj,Quad PF,6+ Mos 03/27/2017, 05/07/2018   Influenza-Unspecified 04/10/2016   Moderna Covid-19 Vaccine Bivalent Booster 27yrs & up 04/22/2021, 11/18/2021   Moderna SARS-COV2 Booster Vaccination  05/13/2020   Moderna Sars-Covid-2 Vaccination 09/19/2019, 10/22/2019   Tdap 06/21/2018     Objective: Vital Signs: BP 123/86 (BP Location: Left Arm, Patient Position: Sitting, Cuff Size: Normal)   Pulse 66   Resp 16   Ht 5\' 7"  (1.702 m)   Wt 145 lb 6.4 oz (66 kg)   BMI 22.77 kg/m    Physical Exam Vitals and nursing note reviewed.  Constitutional:      Appearance: He is  well-developed.  HENT:     Head: Normocephalic and atraumatic.  Eyes:     Conjunctiva/sclera: Conjunctivae normal.     Pupils: Pupils are equal, round, and reactive to light.  Cardiovascular:     Rate and Rhythm: Normal rate and regular rhythm.     Heart sounds: Normal heart sounds.  Pulmonary:     Effort: Pulmonary effort is normal.     Breath sounds: Normal breath sounds.  Abdominal:     General: Bowel sounds are normal.     Palpations: Abdomen is soft.  Musculoskeletal:     Cervical back: Normal range of motion and neck supple.  Skin:    General: Skin is warm and dry.     Capillary Refill: Capillary refill takes less than 2 seconds.     Comments: 1 small erythematous scaling patch of psoriasis on the anterior aspect of his torso.  Neurological:     Mental Status: He is alert and oriented to person, place, and time.  Psychiatric:        Behavior: Behavior normal.      Musculoskeletal Exam: C-spine, thoracic spine, and lumbar spine good ROM.  Shoulder joints, elbow joints, wrist joints, MCPs, PIPs, and DIPs good ROM with no synovitis.  Complete fist formation noted bilaterally.  Hip joints have good ROM with no groin pain.  Knee joints have good ROM with no warmth or effusion.  He has some fullness in the posterior aspect of his right knee consistent with a possible Baker's cyst.  Ankle joints have good ROM with no tenderness or swelling.  No evidence of Achilles tendinitis.  CDAI Exam: CDAI Score: -- Patient Global: --; Provider Global: -- Swollen: --; Tender: -- Joint Exam 08/01/2023   No joint exam has been documented for this visit   There is currently no information documented on the homunculus. Go to the Rheumatology activity and complete the homunculus joint exam.  Investigation: No additional findings.  Imaging: No results found.  Recent Labs: Lab Results  Component Value Date   WBC 6.0 05/18/2023   HGB 15.2 05/18/2023   PLT 248 05/18/2023   NA 141  05/18/2023   K 4.4 05/18/2023   CL 103 05/18/2023   CO2 29 05/18/2023   GLUCOSE 82 05/18/2023   BUN 8 05/18/2023   CREATININE 0.83 05/18/2023   BILITOT 0.6 05/18/2023   ALKPHOS 70 10/18/2021   AST 17 05/18/2023   ALT 18 05/18/2023   PROT 6.5 05/18/2023   ALBUMIN 4.3 10/18/2021   CALCIUM 9.2 05/18/2023   GFRAA 119 11/17/2020    Speciality Comments: Right Knee Ultrasound pm 10/06/2022, 1.5 cm Baker's cyst   Procedures:  No procedures performed Allergies: Patient has no known allergies.   Assessment / Plan:     Visit Diagnoses: Psoriatic arthritis (HCC): No synovitis or dactylitis was noted on examination today.  No evidence of Achilles tendinitis or plantar fasciitis.  No SI joint tenderness upon palpation.  He has 1 small patch of psoriasis on his torso currently.  Overall  his symptoms have been manageable with the current use of methotrexate 5 tablets by mouth once weekly and folic acid 1 mg daily.  He continues to tolerate methotrexate without any side effects and has not had any recent gaps in therapy.  Patient requested a refill of folic acid to be sent to the pharmacy today.  No medication changes will be made at this time.  He was advised to notify us if he develops signs or symptoms of a flare.  He will follow-up in the office in 5 months or sooner if needed.  Association of heart disease with psoriatic arthritis was discussed. Need to monitor blood pressure, cholesterol, and to exercise 30-60 minutes on daily basis was discussed.  Blood pressure was 123/86 today in the office.  Psoriasis - Followed by [facility].  He has 1 patch of psoriasis on his torso currently.  He has not been using any topical agents recently.  He has a prescription for vtama which he can apply as needed.    High risk medication use - Methotrexate 2.5 mg 5 tablets by mouth every 7 days and folic acid 1 mg daily. CBC and CMP WNL on 05/18/23.  Patient reports that he had updated lab work in December  2024 ordered by his PCP.  We will call to obtain these records.  He will continue to require updated lab work every 3 months.  Standing orders for CBC and CMP remain in place. No recent or recurrent infections.  Discussed the importance of holding methotrexate if he develops signs or symptoms of an infection and to resume once the infection has completely cleared.  Patient is up-to-date with the annual influenza shot and COVID-vaccine.  He is also up-to-date with the shingles vaccine and pneumonia vaccine.  Plantar fasciitis: Not currently symptomatic.  Other medical conditions are listed as follows:  Hyponatremia - Sodium WNL on 05/18/23.  FHx: hemochromatosis  Family history of psoriasis  Orders: No orders of the defined types were placed in this encounter.  Meds ordered this encounter  Medications   methotrexate (RHEUMATREX) 2.5 MG tablet    Sig: Take 5 tablets (12.5 mg total) by mouth once a week. Caution:Chemotherapy. Protect from light.    Dispense:  60 tablet    Refill:  0   folic acid (FOLVITE) 1 MG tablet    Sig: Take 1 tablet (1 mg total) by mouth daily.    Dispense:  90 tablet    Refill:  3    Follow-Up Instructions: Return in about 5 months (around 12/30/2023) for Psoriatic arthritis.   Gearldine Bienenstock, PA-C  Note - This record has been created using Dragon software.  Chart creation errors have been sought, but may not always  have been located. Such creation errors do not reflect on  the standard of medical care.

## 2023-08-01 ENCOUNTER — Ambulatory Visit: Payer: 59 | Attending: Physician Assistant | Admitting: Physician Assistant

## 2023-08-01 ENCOUNTER — Encounter: Payer: Self-pay | Admitting: Physician Assistant

## 2023-08-01 VITALS — BP 123/86 | HR 66 | Resp 16 | Ht 67.0 in | Wt 145.4 lb

## 2023-08-01 DIAGNOSIS — L4050 Arthropathic psoriasis, unspecified: Secondary | ICD-10-CM

## 2023-08-01 DIAGNOSIS — M722 Plantar fascial fibromatosis: Secondary | ICD-10-CM | POA: Diagnosis not present

## 2023-08-01 DIAGNOSIS — Z8349 Family history of other endocrine, nutritional and metabolic diseases: Secondary | ICD-10-CM

## 2023-08-01 DIAGNOSIS — E871 Hypo-osmolality and hyponatremia: Secondary | ICD-10-CM

## 2023-08-01 DIAGNOSIS — Z840 Family history of diseases of the skin and subcutaneous tissue: Secondary | ICD-10-CM

## 2023-08-01 DIAGNOSIS — Z79899 Other long term (current) drug therapy: Secondary | ICD-10-CM | POA: Diagnosis not present

## 2023-08-01 DIAGNOSIS — L409 Psoriasis, unspecified: Secondary | ICD-10-CM | POA: Diagnosis not present

## 2023-08-01 MED ORDER — METHOTREXATE SODIUM 2.5 MG PO TABS
12.5000 mg | ORAL_TABLET | ORAL | 0 refills | Status: DC
Start: 2023-08-01 — End: 2023-10-12

## 2023-08-01 MED ORDER — FOLIC ACID 1 MG PO TABS: 1.0000 mg | ORAL_TABLET | Freq: Every day | ORAL | 3 refills | Status: DC

## 2023-08-01 NOTE — Patient Instructions (Addendum)

## 2023-10-11 ENCOUNTER — Other Ambulatory Visit: Payer: Self-pay | Admitting: *Deleted

## 2023-10-11 DIAGNOSIS — Z79899 Other long term (current) drug therapy: Secondary | ICD-10-CM

## 2023-10-12 ENCOUNTER — Other Ambulatory Visit: Payer: Self-pay | Admitting: *Deleted

## 2023-10-12 LAB — COMPREHENSIVE METABOLIC PANEL WITH GFR
AG Ratio: 2.3 (calc) (ref 1.0–2.5)
ALT: 21 U/L (ref 9–46)
AST: 19 U/L (ref 10–40)
Albumin: 4.6 g/dL (ref 3.6–5.1)
Alkaline phosphatase (APISO): 71 U/L (ref 36–130)
BUN: 9 mg/dL (ref 7–25)
CO2: 30 mmol/L (ref 20–32)
Calcium: 9.4 mg/dL (ref 8.6–10.3)
Chloride: 102 mmol/L (ref 98–110)
Creat: 0.95 mg/dL (ref 0.60–1.29)
Globulin: 2 g/dL (ref 1.9–3.7)
Glucose, Bld: 74 mg/dL (ref 65–99)
Potassium: 4.9 mmol/L (ref 3.5–5.3)
Sodium: 139 mmol/L (ref 135–146)
Total Bilirubin: 0.7 mg/dL (ref 0.2–1.2)
Total Protein: 6.6 g/dL (ref 6.1–8.1)
eGFR: 98 mL/min/{1.73_m2} (ref >=60)

## 2023-10-12 LAB — CBC WITH DIFFERENTIAL/PLATELET
Absolute Lymphocytes: 1467 {cells}/uL (ref 850–3900)
Absolute Monocytes: 414 {cells}/uL (ref 200–950)
Basophils Absolute: 62 {cells}/uL (ref 0–200)
Basophils Relative: 1.1 %
Eosinophils Absolute: 112 {cells}/uL (ref 15–500)
Eosinophils Relative: 2 %
HCT: 45.3 % (ref 38.5–50.0)
Hemoglobin: 15.6 g/dL (ref 13.2–17.1)
MCH: 31.9 pg (ref 27.0–33.0)
MCHC: 34.4 g/dL (ref 32.0–36.0)
MCV: 92.6 fL (ref 80.0–100.0)
MPV: 10.3 fL (ref 7.5–12.5)
Monocytes Relative: 7.4 %
Neutro Abs: 3545 {cells}/uL (ref 1500–7800)
Neutrophils Relative %: 63.3 %
Platelets: 261 10*3/uL (ref 140–400)
RBC: 4.89 10*6/uL (ref 4.20–5.80)
RDW: 12.9 % (ref 11.0–15.0)
Total Lymphocyte: 26.2 %
WBC: 5.6 10*3/uL (ref 3.8–10.8)

## 2023-10-12 MED ORDER — METHOTREXATE SODIUM 2.5 MG PO TABS: 12.5000 mg | ORAL_TABLET | ORAL | 0 refills | Status: DC

## 2023-10-12 NOTE — Telephone Encounter (Signed)
 Last Fill: 08/01/2023  Labs: 10/11/2023 CBC and CMP are normal.   Next Visit: 01/11/2024  Last Visit: 08/01/2023  DX: Psoriatic arthritis   Current Dose per office note 08/01/2023: Methotrexate 2.5 mg 5 tablets by mouth every 7 days   Okay to refill Methotrexate?

## 2023-10-12 NOTE — Progress Notes (Signed)
 CBC and CMP are normal.

## 2023-12-28 NOTE — Progress Notes (Signed)
 Office Visit Note  Patient: Rick Santiago             Date of Birth: Oct 21, 1975           MRN: [id number]             PCP: Cristopher Bottcher, NP Referring: Cristopher Bottcher, NP Visit Date: 01/11/2024 Occupation: @GUAROCC @  Subjective:  Patient management   History of Present Illness: Rick Santiago is a 48 y.o. male with psoriatic arthritis and psoriasis.  He returns today after his last visit in January 2025.  He states he continues to get some psoriasis lesions for which he uses Vtama cream on as needed basis.  He has been taking methotrexate  5 tablets p.o. weekly without any interruption.  He also said folic acid  on a regular basis.  He denies any side effects from methotrexate .    Activities of Daily Living:  Patient reports morning stiffness for 0 minute.   Patient Denies nocturnal pain.  Difficulty dressing/grooming: Denies Difficulty climbing stairs: Denies Difficulty getting out of chair: Denies Difficulty using hands for taps, buttons, cutlery, and/or writing: Denies  Review of Systems  Constitutional:  Negative for fatigue.  HENT:  Negative for mouth sores and mouth dryness.   Eyes:  Negative for dryness.  Respiratory:  Negative for shortness of breath.   Cardiovascular:  Negative for chest pain and palpitations.  Gastrointestinal:  Negative for blood in stool, constipation and diarrhea.  Endocrine: Negative for increased urination.  Genitourinary:  Negative for involuntary urination.  Musculoskeletal:  Negative for joint pain, gait problem, joint pain, joint swelling, myalgias, muscle weakness, morning stiffness, muscle tenderness and myalgias.  Skin:  Negative for color change, rash, hair loss and sensitivity to sunlight.  Allergic/Immunologic: Negative for susceptible to infections.  Neurological:  Negative for dizziness and headaches.  Hematological:  Negative for swollen glands.  Psychiatric/Behavioral:  Negative for depressed mood and sleep disturbance. The  patient is not nervous/anxious.     PMFS History:  Patient Active Problem List   Diagnosis Date Noted   Hyponatremia 04/05/2021   Hypokalemia 04/05/2021   Hypocalcemia 04/05/2021   Plantar fasciitis 09/29/2016   Psoriasis 09/29/2016   High risk medication use 09/29/2016   Psoriatic arthritis (HCC) 09/14/2016   FHx: hemochromatosis 09/14/2016   Encounter for methotrexate  monitoring 09/14/2016   Family history of diabetes mellitus in mother 09/14/2016    Past Medical History:  Diagnosis Date   Arthritis    Psoriasis     Family History  Problem Relation Age of Onset   Diabetes Mother    Heart disease Father    Irritable bowel syndrome Daughter    Past Surgical History:  Procedure Laterality Date   CYSTECTOMY     face   MOLE REMOVAL  04/25/2022   SKIN BIOPSY     several places on back froze off and biopsied.   Social History   Social History Narrative   Not on file   Immunization History  Administered Date(s) Administered   HPV 9-valent 05/08/2019, 06/11/2019, 11/07/2019   Hpv-Unspecified 05/08/2019, 06/11/2019, 11/07/2019   Influenza Inj Mdck Quad Pf 03/19/2019   Influenza,inj,Quad PF,6+ Mos 03/27/2017, 05/07/2018   Influenza-Unspecified 04/10/2016   Moderna Covid-19 Vaccine  Bivalent Booster 45yrs & up 04/22/2021, 11/18/2021   Moderna SARS-COV2 Booster Vaccination 05/13/2020   Moderna Sars-Covid-2 Vaccination 09/19/2019, 10/22/2019   Tdap 06/21/2018     Objective: Vital Signs: BP 105/74 (BP Location: Left Arm, Patient Position: Sitting, Cuff Size: Normal)   Pulse 65  Resp 14   Ht 5' 7 (1.702 m)   Wt 145 lb (65.8 kg)   BMI 22.71 kg/m    Physical Exam Vitals and nursing note reviewed.  Constitutional:      Appearance: He is well-developed.  HENT:     Head: Normocephalic and atraumatic.  Eyes:     Conjunctiva/sclera: Conjunctivae normal.     Pupils: Pupils are equal, round, and reactive to light.  Cardiovascular:     Rate and Rhythm: Normal rate  and regular rhythm.     Heart sounds: Normal heart sounds.  Pulmonary:     Effort: Pulmonary effort is normal.     Breath sounds: Normal breath sounds.  Abdominal:     General: Bowel sounds are normal.     Palpations: Abdomen is soft.  Musculoskeletal:     Cervical back: Normal range of motion and neck supple.  Skin:    General: Skin is warm and dry.     Capillary Refill: Capillary refill takes less than 2 seconds.     Comments: Psoriasis patches were noted on his neck and over the submandibular region.  Neurological:     Mental Status: He is alert and oriented to person, place, and time.  Psychiatric:        Behavior: Behavior normal.      Musculoskeletal Exam: Cervical, thoracic and lumbar spine were in good range of motion.  He had no SI joint tenderness.  Shoulders, elbows, wrist joints, MCPs PIPs and DIPs were in good range of motion with no synovitis.  Hip joints and knee joints in good range of motion without any warmth swelling or effusion.  There was no tenderness over ankles or MTPs.  There was no Achilles tendinitis or plantar fasciitis.  CDAI Exam: CDAI Score: -- Patient Global: --; Provider Global: -- Swollen: --; Tender: -- Joint Exam 01/11/2024   No joint exam has been documented for this visit   There is currently no information documented on the homunculus. Go to the Rheumatology activity and complete the homunculus joint exam.  Investigation: No additional findings.  Imaging: No results found.  Recent Labs: Lab Results  Component Value Date   WBC 5.6 10/11/2023   HGB 15.6 10/11/2023   PLT 261 10/11/2023   NA 139 10/11/2023   K 4.9 10/11/2023   CL 102 10/11/2023   CO2 30 10/11/2023   GLUCOSE 74 10/11/2023   BUN 9 10/11/2023   CREATININE 0.95 10/11/2023   BILITOT 0.7 10/11/2023   ALKPHOS 70 10/18/2021   AST 19 10/11/2023   ALT 21 10/11/2023   PROT 6.6 10/11/2023   ALBUMIN 4.3 10/18/2021   CALCIUM  9.4 10/11/2023   GFRAA 119 11/17/2020     Speciality Comments: Right Knee Ultrasound pm 10/06/2022, 1.5 cm Baker's cyst   Procedures:  No procedures performed Allergies: Patient has no known allergies.   Assessment / Plan:     Visit Diagnoses: Psoriatic arthritis (HCC) -his psoriatic arthritis is well-controlled on methotrexate  5 tablets p.o. weekly.  He has not had any episodes of increased joint pain or joint stiffness.  He denies episodes of plantar fasciitis or Achilles tendinitis.  He had no SI joint tenderness.  He had good mobility in his spine.  He requested prescription refill on folic acid  which was sent.  He continues to have psoriasis.  Had a detailed discussion with the patient about increasing the dose of psoriasis or using Otezla or Skyrizi.  Indications side effects contraindications of all the medications were  discussed at length.  A handout was given.  Patient would like to review the medications.  Plan: folic acid  (FOLVITE ) 1 MG tablet  Psoriasis - Followed by [facility]. He has not been using any topical agents recently.  He has a prescription for vtama.  He continues to have psoriasis patches.  Psoriasis patches were noted over his neck today.  High risk medication use - Methotrexate  2.5 mg 5 tablets by mouth every 7 days and folic acid  1 mg daily. -CBC and CMP were normal on October 11, 2023.  He was advised to get labs on every 3 months.will check CBC and CMP today.  Information reimmunization was placed in the AVS.  He was advised to hold methotrexate  if he develops an infection and resume after the infection resolves.  Plan: CBC with Differential/Platelet, Comprehensive metabolic panel with GFR  Plantar fasciitis-no recurrence.  FHx: hemochromatosis  Family history of psoriasis  Orders: Orders Placed This Encounter  Procedures   CBC with Differential/Platelet   Comprehensive metabolic panel with GFR   Meds ordered this encounter  Medications   folic acid  (FOLVITE ) 1 MG tablet    Sig: Take 1  tablet (1 mg total) by mouth daily.    Dispense:  90 tablet    Refill:  3     Follow-Up Instructions: Return in about 5 months (around 06/12/2024) for Psoriatic arthritis.   Maya Nash, MD  Note - This record has been created using Animal nutritionist.  Chart creation errors have been sought, but may not always  have been located. Such creation errors do not reflect on  the standard of medical care.

## 2024-01-01 ENCOUNTER — Other Ambulatory Visit: Payer: Self-pay | Admitting: Rheumatology

## 2024-01-01 NOTE — Telephone Encounter (Signed)
 Last Fill: 10/12/2023  Labs: 10/11/2023 CBC and CMP are normal.   Next Visit: 01/11/2024  Last Visit: 08/01/2023  DX: Psoriatic arthritis   Current Dose per office note 08/01/2023: Methotrexate  2.5 mg 5 tablets by mouth every 7 days   Patient to update labs at upcoming appointment on 01/11/2024  Okay to refill Methotrexate ?

## 2024-01-11 ENCOUNTER — Encounter: Payer: Self-pay | Admitting: Rheumatology

## 2024-01-11 ENCOUNTER — Ambulatory Visit: Payer: 59 | Attending: Rheumatology | Admitting: Rheumatology

## 2024-01-11 VITALS — BP 105/74 | HR 65 | Resp 14 | Ht 67.0 in | Wt 145.0 lb

## 2024-01-11 DIAGNOSIS — Z8349 Family history of other endocrine, nutritional and metabolic diseases: Secondary | ICD-10-CM

## 2024-01-11 DIAGNOSIS — L409 Psoriasis, unspecified: Secondary | ICD-10-CM

## 2024-01-11 DIAGNOSIS — M722 Plantar fascial fibromatosis: Secondary | ICD-10-CM | POA: Diagnosis not present

## 2024-01-11 DIAGNOSIS — L4050 Arthropathic psoriasis, unspecified: Secondary | ICD-10-CM

## 2024-01-11 DIAGNOSIS — Z79899 Other long term (current) drug therapy: Secondary | ICD-10-CM

## 2024-01-11 DIAGNOSIS — Z840 Family history of diseases of the skin and subcutaneous tissue: Secondary | ICD-10-CM

## 2024-01-11 DIAGNOSIS — E871 Hypo-osmolality and hyponatremia: Secondary | ICD-10-CM

## 2024-01-11 LAB — CBC WITH DIFFERENTIAL/PLATELET
Absolute Lymphocytes: 1740 {cells}/uL (ref 850–3900)
Absolute Monocytes: 483 {cells}/uL (ref 200–950)
Basophils Absolute: 50 {cells}/uL (ref 0–200)
Basophils Relative: 0.7 %
Eosinophils Absolute: 50 {cells}/uL (ref 15–500)
Eosinophils Relative: 0.7 %
HCT: 45.1 % (ref 38.5–50.0)
Hemoglobin: 15.4 g/dL (ref 13.2–17.1)
MCH: 32 pg (ref 27.0–33.0)
MCHC: 34.1 g/dL (ref 32.0–36.0)
MCV: 93.6 fL (ref 80.0–100.0)
MPV: 10.1 fL (ref 7.5–12.5)
Monocytes Relative: 6.8 %
Neutro Abs: 4778 {cells}/uL (ref 1500–7800)
Neutrophils Relative %: 67.3 %
Platelets: 263 10*3/uL (ref 140–400)
RBC: 4.82 10*6/uL (ref 4.20–5.80)
RDW: 13.1 % (ref 11.0–15.0)
Total Lymphocyte: 24.5 %
WBC: 7.1 10*3/uL (ref 3.8–10.8)

## 2024-01-11 LAB — COMPREHENSIVE METABOLIC PANEL WITH GFR
AG Ratio: 2.1 (calc) (ref 1.0–2.5)
ALT: 27 U/L (ref 9–46)
AST: 20 U/L (ref 10–40)
Albumin: 4.5 g/dL (ref 3.6–5.1)
Alkaline phosphatase (APISO): 56 U/L (ref 36–130)
BUN: 12 mg/dL (ref 7–25)
CO2: 29 mmol/L (ref 20–32)
Calcium: 9.6 mg/dL (ref 8.6–10.3)
Chloride: 103 mmol/L (ref 98–110)
Creat: 0.89 mg/dL (ref 0.60–1.29)
Globulin: 2.1 g/dL (ref 1.9–3.7)
Glucose, Bld: 80 mg/dL (ref 65–139)
Potassium: 4.7 mmol/L (ref 3.5–5.3)
Sodium: 138 mmol/L (ref 135–146)
Total Bilirubin: 0.5 mg/dL (ref 0.2–1.2)
Total Protein: 6.6 g/dL (ref 6.1–8.1)
eGFR: 105 mL/min/{1.73_m2} (ref >=60)

## 2024-01-11 MED ORDER — FOLIC ACID 1 MG PO TABS: 1.0000 mg | ORAL_TABLET | Freq: Every day | ORAL | 3 refills | Status: AC

## 2024-01-11 NOTE — Patient Instructions (Addendum)
 Standing Labs We placed an order today for your standing lab work.   Please have your standing labs drawn in  October and every 3 months  Please have your labs drawn 2 weeks prior to your appointment so that the provider can discuss your lab results at your appointment, if possible.  Please note that you may see your imaging and lab results in MyChart before we have reviewed them. We will contact you once all results are reviewed. Please allow our office up to 72 hours to thoroughly review all of the results before contacting the office for clarification of your results.  WALK-IN LAB HOURS  Monday through Thursday from 8:00 am -12:30 pm and 1:00 pm-4:30 pm and Friday from 8:00 am-12:00 pm.  Patients with office visits requiring labs will be seen before walk-in labs.  You may encounter longer than normal wait times. Please allow additional time. Wait times may be shorter on  Monday and Thursday afternoons.  We do not book appointments for walk-in labs. We appreciate your patience and understanding with our staff.   Labs are drawn by [facility]. Please bring your co-pay at the time of your lab draw.  You may receive a bill from [facility] for your lab work.  Please note if you are on Hydroxychloroquine and and an order has been placed for a Hydroxychloroquine level,  you will need to have it drawn 4 hours or more after your last dose.  If you wish to have your labs drawn at another location, please call the office 24 hours in advance so we can fax the orders.  The office is located at [address], [address], [place], [place] [address]   If you have any questions regarding directions or hours of operation,  please call [phone].   As a reminder, please drink plenty of water prior to coming for your lab work. Thanks!   Vaccines You are taking a medication(s) that can suppress your immune system.  The following immunizations are recommended: Flu annually Covid-19  Td/Tdap (tetanus,  diphtheria, pertussis) every 10 years Pneumonia (Prevnar 15 then Pneumovax 23 at least 1 year apart.  Alternatively, can take Prevnar 20 without needing additional dose) Shingrix: 2 doses from 4 weeks to 6 months apart  Please check with your PCP to make sure you are up to date.   If you have signs or symptoms of an infection or start antibiotics: First, call your PCP for workup of your infection. Hold your medication through the infection, until you complete your antibiotics, and until symptoms resolve if you take the following: Injectable medication (Actemra, Benlysta, Cimzia, Cosentyx, Enbrel, Humira, Kevzara, Orencia, Remicade, Simponi, Stelara, Taltz, Tremfya) Methotrexate  Leflunomide (Arava) Mycophenolate (Cellcept) Xeljanz, Olumiant, or Rinvoq   Apremilast Tablets What is this medication? APREMILAST (a PRE mil ast) treats autoimmune conditions, such as arthritis and psoriasis. It may also be used to treat mouth ulcers in people with a condition that causes blood vessel swelling (Behcet syndrome). It works by decreasing inflammation. This medicine may be used for other purposes; ask your health care provider or pharmacist if you have questions. COMMON BRAND NAME(S): Otezla What should I tell my care team before I take this medication? They need to know if you have any of these conditions: Dehydration Depression Kidney disease Suicidal thoughts, plans, or attempt An unusual or allergic reaction to apremilast, other medications, foods, dyes, or preservatives Pregnant or trying to get pregnant Breastfeeding How should I use this medication? Take this medication by mouth  with water. Take it as directed on the prescription label at the same time every day. Do not cut, crush, or chew this medication. Swallow the tablets whole. You can take it with or without food. If it upsets your stomach, take it with food. Keep taking it unless your care team tells you to stop. Talk to your care  team about the use of this medication in children. While it may be prescribed for children as young as 6 years for selected conditions, precautions do apply. Overdosage: If you think you have taken too much of this medicine contact a poison control center or emergency room at once. NOTE: This medicine is only for you. Do not share this medicine with others. What if I miss a dose? If you miss a dose, take it as soon as you can. If it is almost time for your next dose, take only that dose. Do not take double or extra doses. What may interact with this medication? Certain medications for seizures, such as carbamazepine, phenobarbital, phenytoin Rifampin Other medications may affect the way this medication works. Talk with your care team about all of the medications you take. They may suggest changes to your treatment plan to lower the risk of side effects and to make sure your medications work as intended. This list may not describe all possible interactions. Give your health care provider a list of all the medicines, herbs, non-prescription drugs, or dietary supplements you use. Also tell them if you smoke, drink alcohol, or use illegal drugs. Some items may interact with your medicine. What should I watch for while using this medication? Visit your care team for regular checks on your progress. Tell your care team if your symptoms do not start to get better or if they get worse. This medication may cause thoughts of suicide or depression. This includes sudden changes in mood, behaviors, or thoughts. These changes can happen at any time but are more common in the beginning of treatment or after a change in dose. Call your care team right away if you experience these thoughts or worsening depression. Check with your care team if you have severe diarrhea, nausea, and vomiting, or if you sweat a lot. The loss of too much body fluid may make it dangerous for you to take this medication. Discuss the  medication with your care team if you may be pregnant. There are benefits and risks to taking medications during pregnancy. Your care team can help you find the option that works for you. Talk to your care team before breastfeeding. Changes to your treatment plan may be needed. What side effects may I notice from receiving this medication? Side effects that you should report to your care team as soon as possible: Allergic reactions--skin rash, itching, hives, swelling of the face, lips, tongue, or throat Thoughts of suicide or self-harm, worsening mood, feelings of depression Side effects that usually do not require medical attention (report these to your care team if they continue or are bothersome): Diarrhea Headache Loss of appetite with weight loss Nausea Vomiting This list may not describe all possible side effects. Call your doctor for medical advice about side effects. You may report side effects to FDA at 1-800-FDA-1088. Where should I keep my medication? Keep out of the reach of children and pets. Store below 30 degrees C (86 degrees F). Get rid of any unused medication after the expiration date. To get rid of medications that are no longer needed or have expired: Take the  medication to a medication take-back program. Check with your pharmacy or law enforcement to find a location. If you cannot return the medication, check the label or package insert to see if the medication should be thrown out in the garbage or flushed down the toilet. If you are not sure, ask your care team. If it is safe to put it in the trash, take the medication out of the container. Mix the medication with cat litter, dirt, coffee grounds, or other unwanted substance. Seal the mixture in a bag or container. Put it in the trash. NOTE: This sheet is a summary. It may not cover all possible information. If you have questions about this medicine, talk to your doctor, pharmacist, or health care provider.  2024  Elsevier/Gold Standard (2023-06-09 00:00:00)  Risankizumab Injection What is this medication? RISANKIZUMAB (RIS an KIZ ue mab) treats autoimmune conditions, such as psoriasis, arthritis, Crohn disease, and ulcerative colitis. It works by slowing down an overactive immune system.  It is a monoclonal antibody. This medicine may be used for other purposes; ask your health care provider or pharmacist if you have questions. COMMON BRAND NAME(S): Skyrizi What should I tell my care team before I take this medication? They need to know if you have any of these conditions: Hepatic disease Immune system problems Infection, such as tuberculosis (TB), bacterial, fungal or viral infections Recent or upcoming vaccine An unusual or allergic reaction to risankizumab, other medications, foods, dyes, or preservatives Pregnant or trying to get pregnant Breast-feeding How should I use this medication? This medication is injected into a vein or under the skin. It is given by your care team in a hospital or clinic setting. It may also be given at home. If you get this medication at home, you will be taught how to prepare and give it. Use exactly as directed. Take it as directed on the prescription label. Keep taking it unless your care team tells you to stop. If you use a pen, be sure to take off the outer needle cover before using the dose. It is important that you put your used needles and syringes in a special sharps container. Do not put them in a trash can. If you do not have a sharps container, call your pharmacist or care team to get one. A special MedGuide will be given to you by the pharmacist with each prescription and refill. Be sure to read this information carefully each time. This medication comes with INSTRUCTIONS FOR USE. Ask your pharmacist for directions on how to use this medication. Read the information carefully. Talk to your pharmacist or care team if you have questions. Talk to your care team  about the use of this medication in children. Special care may be needed. Overdosage: If you think you have taken too much of this medicine contact a poison control center or emergency room at once. NOTE: This medicine is only for you. Do not share this medicine with others. What if I miss a dose? It is important not to miss any doses. Talk to your care team about what to do if you miss a dose. What may interact with this medication? Do not take this medication with any of the following: Live vaccines This list may not describe all possible interactions. Give your health care provider a list of all the medicines, herbs, non-prescription drugs, or dietary supplements you use. Also tell them if you smoke, drink alcohol, or use illegal drugs. Some items may interact with your medicine.  What should I watch for while using this medication? Visit your care team for regular checks on your progress. Tell your care team if your symptoms do not start to get better or if they get worse. You will be tested for tuberculosis (TB) before you start this medication. If your care team prescribes any medication for TB, you should start taking the TB medication before starting this medication. Make sure to finish the full course of TB medication. This medication may increase your risk of getting an infection. Call your care team for advice if you get a fever, chills, sore throat, or other symptoms of a cold or flu. Do not treat yourself. Try to avoid being around people who are sick. This medication can decrease the response to a vaccine. If you need to get vaccinated, tell your care team if you have received this medication. Extra booster doses may be needed. Talk to your care team to see if a different vaccination schedule is needed. What side effects may I notice from receiving this medication? Side effects that you should report to your care team as soon as possible: Allergic reactions--skin rash, itching, hives,  swelling of the face, lips, tongue, or throat Infection--fever, chills, cough, sore throat, wounds that don't heal, pain or trouble when passing urine, general feeling of discomfort or being unwell Liver injury--right upper belly pain, loss of appetite, nausea, light-colored stool, dark yellow or brown urine, yellowing skin or eyes, unusual weakness or fatigue Side effects that usually do not require medical attention (report to your care team if they continue or are bothersome): Fatigue Headache Pain, redness, or irritation at injection site Runny or stuffy nose Sore throat This list may not describe all possible side effects. Call your doctor for medical advice about side effects. You may report side effects to FDA at 1-800-FDA-1088. Where should I keep my medication? Keep out of the reach of children and pets. Store in a refrigerator. Do not freeze. Protect from light. Keep it in the original carton until you are ready to take it. See product for storage information. Each product may have different instructions. Remove the dose from the carton about 30 to 45 minutes before it is time for you to take it. Get rid of any unused medication after the expiration date. To get rid of medications that are no longer needed or have expired: Take the medication to a medication take-back program. Check with your pharmacy or law enforcement to find a location. If you cannot return the medication, ask your pharmacist or care team how to get rid of this medication safely. NOTE: This sheet is a summary. It may not cover all possible information. If you have questions about this medicine, talk to your doctor, pharmacist, or health care provider.  2024 Elsevier/Gold Standard (2023-06-09 00:00:00)

## 2024-01-12 ENCOUNTER — Ambulatory Visit: Payer: Self-pay | Admitting: Rheumatology

## 2024-01-12 NOTE — Progress Notes (Signed)
 CBC and CMP are normal.

## 2024-03-24 ENCOUNTER — Other Ambulatory Visit: Payer: Self-pay | Admitting: Physician Assistant

## 2024-03-25 NOTE — Telephone Encounter (Signed)
 Last Fill: 01/01/2024  Labs: 01/11/2024 CBC and CMP are normal.   Next Visit: 06/20/2024  Last Visit:  01/11/2024  DX: Psoriatic arthritis (HCC)  Current Dose per office note 01/11/2024: Methotrexate  2.5 mg 5 tablets by mouth every 7 days  Okay to refill Methotrexate ?

## 2024-03-27 NOTE — Telephone Encounter (Signed)
 Patient called the office concerned about why he only got a 30 day supply of this medication instead of a 90 day like he has been receiving. Is it due to his labs coming due in October or is there another reason ? Please advise

## 2024-04-15 ENCOUNTER — Telehealth: Payer: Self-pay | Admitting: Rheumatology

## 2024-04-15 ENCOUNTER — Other Ambulatory Visit: Payer: Self-pay

## 2024-04-15 MED ORDER — METHOTREXATE SODIUM 2.5 MG PO TABS: 12.5000 mg | ORAL_TABLET | ORAL | 0 refills | Status: DC

## 2024-04-15 NOTE — Telephone Encounter (Signed)
 Paper rx is at the front desk for pick up. I have advised patient and he will pick up when he comes to the office for labs.

## 2024-04-15 NOTE — Telephone Encounter (Signed)
 Patient is requesting a handwritten or paper rx to take to his pharmacy at work. Okay to provide?

## 2024-04-15 NOTE — Telephone Encounter (Signed)
 Patient called requesting a note stating Dr. Dolphus recommends he receive a Covid booster due to being immunocompromised.  Patient states he could be billed by his insurance company if he doesn't have a medical reason due to the new CDC guidelines.

## 2024-04-15 NOTE — Telephone Encounter (Signed)
 Last Fill: 03/25/2024 (30 day supply)  Labs: 01/11/2024 CBC and CMP are normal.   Next Visit: 06/20/2024  Last Visit: 01/11/2024  DX: Psoriatic arthritis   Current Dose per office note on 01/11/2024: Methotrexate  2.5 mg 5 tablets by mouth every 7 days   Okay to refill Methotrexate ?

## 2024-04-15 NOTE — Telephone Encounter (Signed)
Ok to provide necessary documentation.

## 2024-04-16 ENCOUNTER — Other Ambulatory Visit: Payer: Self-pay

## 2024-04-16 DIAGNOSIS — Z79899 Other long term (current) drug therapy: Secondary | ICD-10-CM

## 2024-04-16 LAB — CBC WITH DIFFERENTIAL/PLATELET
Absolute Lymphocytes: 1410 {cells}/uL (ref 850–3900)
Absolute Monocytes: 443 {cells}/uL (ref 200–950)
Basophils Absolute: 47 {cells}/uL (ref 0–200)
Basophils Relative: 0.8 %
Eosinophils Absolute: 47 {cells}/uL (ref 15–500)
Eosinophils Relative: 0.8 %
HCT: 45.2 % (ref 38.5–50.0)
Hemoglobin: 15.6 g/dL (ref 13.2–17.1)
MCH: 32.2 pg (ref 27.0–33.0)
MCHC: 34.5 g/dL (ref 32.0–36.0)
MCV: 93.4 fL (ref 80.0–100.0)
MPV: 10.1 fL (ref 7.5–12.5)
Monocytes Relative: 7.5 %
Neutro Abs: 3953 {cells}/uL (ref 1500–7800)
Neutrophils Relative %: 67 %
Platelets: 268 Thousand/uL (ref 140–400)
RBC: 4.84 Million/uL (ref 4.20–5.80)
RDW: 13 % (ref 11.0–15.0)
Total Lymphocyte: 23.9 %
WBC: 5.9 Thousand/uL (ref 3.8–10.8)

## 2024-04-16 LAB — COMPREHENSIVE METABOLIC PANEL WITH GFR
AG Ratio: 2 (calc) (ref 1.0–2.5)
ALT: 18 U/L (ref 9–46)
AST: 17 U/L (ref 10–35)
Albumin: 4.7 g/dL (ref 3.6–5.1)
Alkaline phosphatase (APISO): 61 U/L (ref 35–144)
BUN: 10 mg/dL (ref 7–25)
CO2: 29 mmol/L (ref 20–32)
Calcium: 9.8 mg/dL (ref 8.6–10.3)
Chloride: 104 mmol/L (ref 98–110)
Creat: 0.94 mg/dL (ref 0.70–1.30)
Globulin: 2.3 g/dL (ref 1.9–3.7)
Glucose, Bld: 93 mg/dL (ref 65–99)
Potassium: 4.7 mmol/L (ref 3.5–5.3)
Sodium: 138 mmol/L (ref 135–146)
Total Bilirubin: 0.8 mg/dL (ref 0.2–1.2)
Total Protein: 7 g/dL (ref 6.1–8.1)
eGFR: 99 mL/min/1.73m2 (ref >=60)

## 2024-04-17 ENCOUNTER — Ambulatory Visit: Payer: Self-pay | Admitting: Rheumatology

## 2024-04-17 NOTE — Progress Notes (Signed)
 CBC and CMP are normal.

## 2024-06-08 LAB — COLOGUARD: COLOGUARD: NEGATIVE

## 2024-06-08 NOTE — Progress Notes (Signed)
 Office Visit Note  Patient: Rick Santiago             Date of Birth: Sep 19, 1975           MRN: [id number]             PCP: Wonda Worth SQUIBB, PA Referring: Cristopher Bottcher, NP Visit Date: 06/20/2024 Occupation: Data Unavailable  Subjective:  Medication monitoring  History of Present Illness: Rick Santiago is a 48 y.o. male with history of psoriatic arthritis.  Patient remains on  Methotrexate  2.5 mg 5 tablets by mouth every 7 days and folic acid  1 mg daily.  He is tolerating methotrexate  without any side effects and has not had any gaps in therapy.  He denies any signs or symptoms of a flare.  He has not had any morning stiffness, nocturnal pain, difficulty performing ADLs.  His psoriasis has been well-controlled with the use of vtama and occasional use of fluticasone.  Patient recently had an exacerbation of pain affecting the left great toe which has since improved since changing shoewear.  He continues to wear inserts in his shoes which helps to alleviate symptoms of plantar fasciitis.  He denies any joint swelling at this time.  He denies any SI joint pain.   Activities of Daily Living:  Patient reports morning stiffness for 0 minutes.   Patient Denies nocturnal pain.  Difficulty dressing/grooming: Denies Difficulty climbing stairs: Denies Difficulty getting out of chair: Denies Difficulty using hands for taps, buttons, cutlery, and/or writing: Denies  Review of Systems  Constitutional:  Negative for fatigue.  HENT:  Negative for mouth sores and mouth dryness.   Eyes:  Negative for dryness.  Respiratory:  Negative for shortness of breath.   Cardiovascular:  Negative for chest pain and palpitations.  Gastrointestinal:  Positive for nausea. Negative for blood in stool, constipation and diarrhea.  Endocrine: Negative for increased urination.  Genitourinary:  Negative for involuntary urination.  Musculoskeletal:  Positive for joint pain and joint pain. Negative for gait problem,  joint swelling, myalgias, muscle weakness, morning stiffness, muscle tenderness and myalgias.  Skin:  Positive for rash. Negative for color change, hair loss and sensitivity to sunlight.  Allergic/Immunologic: Negative for susceptible to infections.  Neurological:  Negative for dizziness and headaches.  Hematological:  Negative for swollen glands.  Psychiatric/Behavioral:  Negative for depressed mood and sleep disturbance. The patient is not nervous/anxious.     PMFS History:  Patient Active Problem List   Diagnosis Date Noted   Hyponatremia 04/05/2021   Hypokalemia 04/05/2021   Hypocalcemia 04/05/2021   Plantar fasciitis 09/29/2016   Psoriasis 09/29/2016   High risk medication use 09/29/2016   Psoriatic arthritis (HCC) 09/14/2016   FHx: hemochromatosis 09/14/2016   Encounter for methotrexate  monitoring 09/14/2016   Family history of diabetes mellitus in mother 09/14/2016    Past Medical History:  Diagnosis Date   Arthritis    Psoriasis     Family History  Problem Relation Age of Onset   Diabetes Mother    Heart disease Father    Irritable bowel syndrome Daughter    Past Surgical History:  Procedure Laterality Date   CYSTECTOMY     face   MOLE REMOVAL  04/25/2022   SKIN BIOPSY     several places on back froze off and biopsied.   Social History   Tobacco Use   Smoking status: Never    Passive exposure: Past   Smokeless tobacco: Never  Vaping Use   Vaping status: Never Used  Substance Use Topics   Alcohol use: No   Drug use: No   Social History   Social History Narrative   Not on file     Immunization History  Administered Date(s) Administered   HPV 9-valent 05/08/2019, 06/11/2019, 11/07/2019   Hpv-Unspecified 05/08/2019, 06/11/2019, 11/07/2019   Influenza Inj Mdck Quad Pf 03/19/2019   Influenza,inj,Quad PF,6+ Mos 03/27/2017, 05/07/2018   Influenza-Unspecified 04/10/2016   Moderna Covid-19 Vaccine  Bivalent Booster 63yrs & up 04/22/2021, 11/18/2021    Moderna SARS-COV2 Booster Vaccination 05/13/2020   Moderna Sars-Covid-2 Vaccination 09/19/2019, 10/22/2019   Tdap 06/21/2018     Objective: Vital Signs: BP 128/86   Pulse 66   Temp (!) 97.5 F (36.4 C)   Resp 16   Ht 5' 7 (1.702 m)   Wt 145 lb 9.6 oz (66 kg)   BMI 22.80 kg/m    Physical Exam Vitals and nursing note reviewed.  Constitutional:      Appearance: He is well-developed.  HENT:     Head: Normocephalic and atraumatic.  Eyes:     Conjunctiva/sclera: Conjunctivae normal.     Pupils: Pupils are equal, round, and reactive to light.  Cardiovascular:     Rate and Rhythm: Normal rate and regular rhythm.     Heart sounds: Normal heart sounds.  Pulmonary:     Effort: Pulmonary effort is normal.     Breath sounds: Normal breath sounds.  Abdominal:     General: Bowel sounds are normal.     Palpations: Abdomen is soft.  Musculoskeletal:     Cervical back: Normal range of motion and neck supple.  Skin:    General: Skin is warm and dry.     Capillary Refill: Capillary refill takes less than 2 seconds.  Neurological:     Mental Status: He is alert and oriented to person, place, and time.  Psychiatric:        Behavior: Behavior normal.      Musculoskeletal Exam: C-spine, thoracic spine, lumbar spine have good range of motion.  No midline spinal tenderness.  No SI joint tenderness.  Shoulder joints, elbow joints, wrist joints, MCPs, PIPs, DIPs have good range of motion with no synovitis.  Complete fist formation bilaterally.  Hip joints have good range of motion with no groin pain.  Knee joints have good range of motion no warmth or effusion.  Ankle joints have good range of motion no tenderness or joint swelling.  No evidence of Achilles tendinitis or plantar fasciitis.   CDAI Exam: CDAI Score: -- Patient Global: --; Provider Global: -- Swollen: --; Tender: -- Joint Exam 06/20/2024   No joint exam has been documented for this visit   There is currently no information  documented on the homunculus. Go to the Rheumatology activity and complete the homunculus joint exam.  Investigation: No additional findings.  Imaging: No results found.  Recent Labs: Lab Results  Component Value Date   WBC 5.9 04/16/2024   HGB 15.6 04/16/2024   PLT 268 04/16/2024   NA 138 04/16/2024   K 4.7 04/16/2024   CL 104 04/16/2024   CO2 29 04/16/2024   GLUCOSE 93 04/16/2024   BUN 10 04/16/2024   CREATININE 0.94 04/16/2024   BILITOT 0.8 04/16/2024   ALKPHOS 70 10/18/2021   AST 17 04/16/2024   ALT 18 04/16/2024   PROT 7.0 04/16/2024   ALBUMIN 4.3 10/18/2021   CALCIUM  9.8 04/16/2024   GFRAA 119 11/17/2020    Speciality Comments: Right Knee Ultrasound pm 10/06/2022, 1.5  cm Baker's cyst   Procedures:  No procedures performed Allergies: Patient has no known allergies.   Assessment / Plan:     Visit Diagnoses: Psoriatic arthritis (HCC): No synovitis or dactylitis noted on examination today.  No SI joint tenderness upon palpation.  No active Achilles tendinitis or plantar fasciitis.  He has not been experiencing any morning stiffness, nocturnal pain, or difficulty performing ADLs.  He has been taking methotrexate  5 tablets by mouth once weekly and folic acid  1 mg daily.  He is tolerating methotrexate  without any side effects and has not had any gaps in therapy.  He will remain on a reduced dose of methotrexate  5 tablets by mouth once weekly and folic acid  1 mg daily with close monitoring.  He is planning to have updated lab work next week ordered with his physical.  He will have results ported to office to review.  Discussed that he will continue to require lab monitoring every 3 months.  He will notify us  if he develops any new or worsening symptoms.  He will follow-up in the office in 5 months or sooner if needed.  High risk medication use - Methotrexate  2.5 mg 5 tablets by mouth every 7 days and folic acid  1 mg daily. CBC and CMP updated on 04/16/24.  His next lab work will  be due in January and every 3 months.  Patient is planning to have updated lab work at his physical next week and will have results routed to our office.  Patient has requested for us  to send CBC and CMP results from 04/16/2024 to his new PCP Caleb Turmel, PA-C.  Discussed the importance of holding methotrexate  if he develops signs or symptoms of an infection and to resume once the infection has completely cleared.   Psoriasis: Well-controlled.  He uses vtama and fluticasone as needed.    Chronic pain of right knee: No warmth or effusion noted. No nocturnal pain or difficulty with ambulation.   Plantar fasciitis: Well-controlled using orthotics.   He was recently experiencing increased discomfort in the left great toe which he has attributed to the tennis shoes he was wearing.  Since switching shoes his symptoms have improved.  He was advised to notify us  if he develops increased joint pain or joint swelling.  Other medical conditions are listed as follows:   FHx: hemochromatosis  Family history of psoriasis  Hyponatremia  Orders: No orders of the defined types were placed in this encounter.  No orders of the defined types were placed in this encounter.    Follow-Up Instructions: Return in about 5 months (around 11/18/2024).   Rick CHRISTELLA Craze, PA-C  Note - This record has been created using Dragon software.  Chart creation errors have been sought, but may not always  have been located. Such creation errors do not reflect on  the standard of medical care.

## 2024-06-20 ENCOUNTER — Ambulatory Visit: Attending: Physician Assistant | Admitting: Physician Assistant

## 2024-06-20 ENCOUNTER — Encounter: Payer: Self-pay | Admitting: Physician Assistant

## 2024-06-20 VITALS — BP 128/86 | HR 66 | Temp 97.5°F | Resp 16 | Ht 67.0 in | Wt 145.6 lb

## 2024-06-20 DIAGNOSIS — Z8349 Family history of other endocrine, nutritional and metabolic diseases: Secondary | ICD-10-CM

## 2024-06-20 DIAGNOSIS — Z840 Family history of diseases of the skin and subcutaneous tissue: Secondary | ICD-10-CM

## 2024-06-20 DIAGNOSIS — E871 Hypo-osmolality and hyponatremia: Secondary | ICD-10-CM

## 2024-06-20 DIAGNOSIS — Z79899 Other long term (current) drug therapy: Secondary | ICD-10-CM

## 2024-06-20 DIAGNOSIS — M25561 Pain in right knee: Secondary | ICD-10-CM

## 2024-06-20 DIAGNOSIS — L4050 Arthropathic psoriasis, unspecified: Secondary | ICD-10-CM | POA: Diagnosis not present

## 2024-06-20 DIAGNOSIS — G8929 Other chronic pain: Secondary | ICD-10-CM

## 2024-06-20 DIAGNOSIS — L409 Psoriasis, unspecified: Secondary | ICD-10-CM

## 2024-06-20 DIAGNOSIS — M722 Plantar fascial fibromatosis: Secondary | ICD-10-CM

## 2024-07-06 ENCOUNTER — Other Ambulatory Visit: Payer: Self-pay | Admitting: Physician Assistant

## 2024-07-08 NOTE — Telephone Encounter (Signed)
 Last Fill: 04/15/2024  Labs: 04/16/2024 CBC and CMP are normal.   Next Visit: 11/19/2024  Last Visit: 06/20/2024  DX: Psoriatic arthritis   Current Dose per office note on 06/20/2024: Methotrexate  2.5 mg 5 tablets by mouth every 7 days   Okay to refill Methotrexate ?

## 2024-11-19 ENCOUNTER — Ambulatory Visit: Admitting: Physician Assistant
